# Patient Record
Sex: Female | Born: 2000 | Race: White | Hispanic: No | Marital: Single | Smoking: Never smoker
Health system: Southern US, Community
[De-identification: ages and names within clinical notes are randomized; demographics above are authoritative.]

## PROBLEM LIST (undated history)

## (undated) DIAGNOSIS — J039 Acute tonsillitis, unspecified: Secondary | ICD-10-CM

## (undated) DIAGNOSIS — J309 Allergic rhinitis, unspecified: Secondary | ICD-10-CM

## (undated) DIAGNOSIS — J358 Other chronic diseases of tonsils and adenoids: Secondary | ICD-10-CM

## (undated) DIAGNOSIS — J45909 Unspecified asthma, uncomplicated: Secondary | ICD-10-CM

## (undated) DIAGNOSIS — R196 Halitosis: Secondary | ICD-10-CM

---
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
## 2020-03-26 ENCOUNTER — Emergency Department
Admission: EM | Admit: 2020-03-26 | Discharge: 2020-03-27 | Disposition: A | Discharge status: Other Acute Inpatient Hospital | Payer: BC Managed Care – PPO | Source: Home / Self Care | Attending: Emergency Medicine | Admitting: Emergency Medicine

## 2020-03-26 ENCOUNTER — Other Ambulatory Visit: Payer: Self-pay

## 2020-03-26 ENCOUNTER — Encounter: Payer: Self-pay | Admitting: Emergency Medicine

## 2020-03-26 DIAGNOSIS — F22 Delusional disorders: Secondary | ICD-10-CM | POA: Diagnosis present

## 2020-03-26 DIAGNOSIS — F312 Bipolar disorder, current episode manic severe with psychotic features: Secondary | ICD-10-CM | POA: Insufficient documentation

## 2020-03-26 DIAGNOSIS — Z20822 Contact with and (suspected) exposure to covid-19: Secondary | ICD-10-CM | POA: Insufficient documentation

## 2020-03-26 DIAGNOSIS — F23 Brief psychotic disorder: Secondary | ICD-10-CM | POA: Diagnosis present

## 2020-03-26 DIAGNOSIS — Z79899 Other long term (current) drug therapy: Secondary | ICD-10-CM | POA: Insufficient documentation

## 2020-03-26 DIAGNOSIS — R4689 Other symptoms and signs involving appearance and behavior: Secondary | ICD-10-CM

## 2020-03-26 DIAGNOSIS — F319 Bipolar disorder, unspecified: Secondary | ICD-10-CM | POA: Diagnosis not present

## 2020-03-26 LAB — CBC
HCT: 40.3 % (ref 36.0–46.0)
Hemoglobin: 13.7 g/dL (ref 12.0–15.0)
MCH: 29.3 pg (ref 26.0–34.0)
MCHC: 34 g/dL (ref 30.0–36.0)
MCV: 86.1 fL (ref 80.0–100.0)
Platelets: 389 10*3/uL (ref 150–400)
RBC: 4.68 MIL/uL (ref 3.87–5.11)
RDW: 13.1 % (ref 11.5–15.5)
WBC: 6.5 10*3/uL (ref 4.0–10.5)
nRBC: 0 % (ref 0.0–0.2)

## 2020-03-26 LAB — COMPREHENSIVE METABOLIC PANEL
ALT: 16 U/L (ref 0–44)
AST: 24 U/L (ref 15–41)
Albumin: 4.6 g/dL (ref 3.5–5.0)
Alkaline Phosphatase: 56 U/L (ref 38–126)
Anion gap: 11 (ref 5–15)
BUN: 9 mg/dL (ref 6–20)
CO2: 24 mmol/L (ref 22–32)
Calcium: 9.3 mg/dL (ref 8.9–10.3)
Chloride: 102 mmol/L (ref 98–111)
Creatinine, Ser: 0.74 mg/dL (ref 0.44–1.00)
GFR calc Af Amer: >60 mL/min (ref >60)
GFR calc non Af Amer: >60 mL/min (ref >60)
Glucose, Bld: 88 mg/dL (ref 70–99)
Potassium: 3.7 mmol/L (ref 3.5–5.1)
Sodium: 137 mmol/L (ref 135–145)
Total Bilirubin: 0.6 mg/dL (ref 0.3–1.2)
Total Protein: 8.5 g/dL — ABNORMAL HIGH (ref 6.5–8.1)

## 2020-03-26 LAB — URINE DRUG SCREEN, QUALITATIVE (ARMC ONLY)
Amphetamines, Ur Screen: NOT DETECTED
Barbiturates, Ur Screen: NOT DETECTED
Benzodiazepine, Ur Scrn: NOT DETECTED
Cannabinoid 50 Ng, Ur ~~LOC~~: NOT DETECTED
Cocaine Metabolite,Ur ~~LOC~~: NOT DETECTED
MDMA (Ecstasy)Ur Screen: NOT DETECTED
Methadone Scn, Ur: NOT DETECTED
Opiate, Ur Screen: NOT DETECTED
Phencyclidine (PCP) Ur S: NOT DETECTED
Tricyclic, Ur Screen: NOT DETECTED

## 2020-03-26 LAB — ACETAMINOPHEN LEVEL: Acetaminophen (Tylenol), Serum: <10 ug/mL — ABNORMAL LOW (ref 10–30)

## 2020-03-26 LAB — SALICYLATE LEVEL: Salicylate Lvl: <7 mg/dL — ABNORMAL LOW (ref 7.0–30.0)

## 2020-03-26 LAB — ETHANOL: Alcohol, Ethyl (B): <10 mg/dL (ref <10)

## 2020-03-26 LAB — POCT PREGNANCY, URINE: Preg Test, Ur: NEGATIVE

## 2020-03-26 NOTE — ED Provider Notes (Signed)
[facility] Emergency Department Provider Note ____________________________________________   First MD Initiated Contact with Patient 03/26/20 1034     (approximate)  I have reviewed the triage vital signs and the nursing notes.   HISTORY  Chief Complaint Psychiatric Evaluation  Level 5 caveat: History present illness limited due to disorganized historian  HPI Chelsea Patel is a 19 y.o. female with PMH as noted below including a history of psychosis who presents with multiple complaints.  The patient states that she feels like she does not have a body and is just bones.  She believes that people are manipulating her, and that someone or something is harassing her while she is sleeping.  She states she feels like she cannot be herself.  However, she denies any acute medical complaints.  She states that she takes her psychiatric medications inconsistently.  Past Medical History:  Diagnosis Date  . Asthma    hx of  . Halitosis   . Rhinitis, allergic   . Tonsillith   . Tonsillitis    chronic    Patient Active Problem List   Diagnosis Date Noted  . Delusions ([place]) 07/07/2019  . Paranoia ([place]) 07/07/2019  . Acute psychosis ([place]) 07/07/2019    Past Surgical History:  Procedure Laterality Date  . TONSILLECTOMY AND ADENOIDECTOMY N/A 03/22/2017   Procedure: TONSILLECTOMY AND POSSIBLY  ADENOIDECTOMY;  Surgeon: Carloyn Manner, MD;  Location: [place];  Service: ENT;  Laterality: N/A;  upreg    Prior to Admission medications   Medication Sig Start Date End Date Taking? Authorizing Provider  cetirizine (ZYRTEC) 10 MG tablet Take 10 mg by mouth daily. pm    [provider]  HYDROcodone-acetaminophen (HYCET) 7.5-325 mg/15 ml solution Take 10 mLs by mouth 4 (four) times daily as needed for moderate pain. Patient not taking: Reported on 07/06/2019 03/22/17   Carloyn Manner, MD  lidocaine (XYLOCAINE) 2 % solution Use as directed 10 mLs in  the mouth or throat every 6 (six) hours as needed for mouth pain (Swish and spit). Patient not taking: Reported on 07/06/2019 03/22/17   Carloyn Manner, MD  montelukast (SINGULAIR) 10 MG tablet Take 10 mg by mouth at bedtime.    [provider]  ondansetron (ZOFRAN) 4 MG tablet Take 1 tablet (4 mg total) by mouth every 8 (eight) hours as needed for nausea or vomiting. Patient not taking: Reported on 07/06/2019 03/22/17   Carloyn Manner, MD    Allergies Omnicef [cefdinir] and Latex  History reviewed. No pertinent family history.  Social History Social History   Tobacco Use  . Smoking status: Never Smoker  . Smokeless tobacco: Never Used  Substance Use Topics  . Alcohol use: No  . Drug use: No    Review of Systems Level 5 caveat: Review of systems limited due to disorganized historian  Cardiovascular: Denies chest pain. Respiratory: Denies shortness of breath. Gastrointestinal: No vomiting or diarrhea. Musculoskeletal: Negative for back pain. Neurological: Negative for headache.   ____________________________________________   PHYSICAL EXAM:  VITAL SIGNS: ED Triage Vitals  Enc Vitals Group     BP --      Pulse --      Resp --      Temp --      Temp src --      SpO2 --      Weight 03/26/20 1026 135 lb (61.2 kg)     Height 03/26/20 1026 5\' 6"  (1.676 m)     Head Circumference --  Peak Flow --      Pain Score 03/26/20 1019 0     Pain Loc --      Pain Edu? --      Excl. in GC? --     Constitutional: Alert and oriented. Well appearing and in no acute distress. Eyes: Conjunctivae are normal.  Head: Atraumatic. Nose: No congestion/rhinnorhea. Mouth/Throat: Mucous membranes are moist.   Neck: Normal range of motion.  Cardiovascular: Good peripheral circulation. Respiratory: Normal respiratory effort.  No retractions. Gastrointestinal: No distention.  Musculoskeletal: Extremities warm and well perfused.  Neurologic:  Normal speech and language. No  gross focal neurologic deficits are appreciated.  Skin:  Skin is warm and dry. No rash noted. Psychiatric: Somewhat tangential and rambling, although grossly organized thought.  Denies SI/HI.  ____________________________________________   LABS (all labs ordered are listed, but only abnormal results are displayed)  Labs Reviewed  COMPREHENSIVE METABOLIC PANEL - Abnormal; Notable for the following components:      Result Value   Total Protein 8.5 (*)    All other components within normal limits  SALICYLATE LEVEL - Abnormal; Notable for the following components:   Salicylate Lvl <7.0 (*)    All other components within normal limits  ACETAMINOPHEN LEVEL - Abnormal; Notable for the following components:   Acetaminophen (Tylenol), Serum <10 (*)    All other components within normal limits  ETHANOL  CBC  URINE DRUG SCREEN, QUALITATIVE ([facility] ONLY)  POC URINE PREG, ED  POCT PREGNANCY, URINE   ____________________________________________  EKG   ____________________________________________  RADIOLOGY    ____________________________________________   PROCEDURES  Procedure(s) performed: No  Procedures  Critical Care performed: No ____________________________________________   INITIAL IMPRESSION / ASSESSMENT AND PLAN / ED COURSE  Pertinent labs & imaging results that were available during my care of the patient were reviewed by me and considered in my medical decision making (see chart for details).  19 year old female with PMH as noted above presents with somewhat vague symptoms, feeling she wants help for not feeling like herself.  She denies any acute medical complaints.  She also denies SI or HI.  On exam, the patient is relatively comfortable appearing and her vital signs are normal.  Lab work-up obtained for medical clearance is unremarkable.  Although the patient is somewhat tangential and appears to have somewhat bizarre nonsensical thoughts, at this time, she is  calm and cooperative, able to contract for safety, and wants to be evaluated.  There is no evidence of immediate danger to self or others and she does not require involuntary commitment based on my initial assessment.  I am concerned for psychosis versus possible substance induced symptoms.  I have consulted psychiatry and TTS.  ____________________  The patient has been placed in psychiatric observation due to the need to provide a safe environment for the patient while obtaining psychiatric consultation and evaluation, as well as ongoing medical and medication management to treat the patient's condition.  The patient has not been placed under full IVC at this time.  ----------------------------------------- 4:59 PM on 03/26/2020 -----------------------------------------  I signed the patient out to the oncoming physician Dr. Mayford Knife.  ____________________________________________   FINAL CLINICAL IMPRESSION(S) / ED DIAGNOSES  Final diagnoses:  Behavior concern      NEW MEDICATIONS STARTED DURING THIS VISIT:  New Prescriptions   No medications on file     Note:  This document was prepared using Dragon voice recognition software and may include unintentional dictation errors.    Dionne Bucy, MD  03/26/20 1659  
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
## 2020-03-26 NOTE — ED Notes (Addendum)
Pt wanting to change bed linen; new linen and blankets were given. Pt requesting/given ice water; no other needs voiced at this time. Will continue to monitor Q15 minute rounds.

## 2020-03-26 NOTE — ED Notes (Addendum)
MD Cristophano at bedside speaking to pt at this time.

## 2020-03-26 NOTE — ED Notes (Signed)
This RN asked pt if she is willing to accept visitation from grandmother at this time. Pt st "yes, I want to see my grandmother".

## 2020-03-26 NOTE — ED Notes (Signed)
Psych NP at patient bedside. 

## 2020-03-26 NOTE — ED Notes (Signed)
Pt st "I want to see my grandmother". This Rn has notified pt that STAT desk was informed pt refused visitation from grandmother.

## 2020-03-26 NOTE — ED Notes (Signed)
This RN received a phone call from [employer] [phone].

## 2020-03-26 NOTE — ED Notes (Signed)
Pt requested a food tray. [place] sandwich tray provided.

## 2020-03-26 NOTE — ED Notes (Signed)
ED Provider Siadecki at bedside. 

## 2020-03-26 NOTE — ED Notes (Signed)
Pt st "I do not want any visitors". Pt provided information to STAT front desk.

## 2020-03-26 NOTE — ED Notes (Addendum)
Grandmother at bedside with Georganna Skeans (pt realtions) at bedside at this time. Grandmother provided this RN with (friend) Dondra Prader phone number [phone].

## 2020-03-26 NOTE — Consult Note (Signed)
  Patient is a 19 year old female with extensive trauma history as well as some recent diagnosis of bipolar disorder.  Initially when patient came on she was quite talkative but clear and coherent with her speech and linear with her thought process.  Patient had an entitled nature and was making her demands known.  Patient cooperated for TTS assessment and was able to provide meaningful history.  Upon evaluation by psychiatrist, patient was difficult to arouse and seemed to be falling asleep during the course of the interview.  In addition to this she was making bizarre statements that were illogical however these were interspaced with coherent and logical thoughts.  Patient was given several hours and reassessed  Upon reassessment patient continued to fall asleep shortly after reintroduction, despite no medications be administered.  Writer feels that patient's uncooperation may be volitional.  On the differential is also drug induced psychosis but is not tested on our routine drug test.  The decision was made to continue to observe patient and assess for psychiatric symptoms as the day goes on.
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
## 2020-03-26 NOTE — ED Notes (Signed)
Pt was given food tray with juice. 

## 2020-03-27 ENCOUNTER — Encounter: Payer: Self-pay | Admitting: Behavioral Health

## 2020-03-27 ENCOUNTER — Other Ambulatory Visit: Payer: Self-pay

## 2020-03-27 ENCOUNTER — Inpatient Hospital Stay
Admission: RE | Admit: 2020-03-27 | Discharge: 2020-03-31 | DRG: 885 | Disposition: A | Discharge status: Home / Self Care | Payer: BC Managed Care – PPO | Source: Intra-hospital | Attending: Psychiatry | Admitting: Psychiatry

## 2020-03-27 DIAGNOSIS — Z91040 Latex allergy status: Secondary | ICD-10-CM | POA: Diagnosis not present

## 2020-03-27 DIAGNOSIS — Z20822 Contact with and (suspected) exposure to covid-19: Secondary | ICD-10-CM | POA: Diagnosis present

## 2020-03-27 DIAGNOSIS — Z9114 Patient's other noncompliance with medication regimen: Secondary | ICD-10-CM

## 2020-03-27 DIAGNOSIS — F23 Brief psychotic disorder: Secondary | ICD-10-CM | POA: Diagnosis present

## 2020-03-27 DIAGNOSIS — J45909 Unspecified asthma, uncomplicated: Secondary | ICD-10-CM | POA: Diagnosis present

## 2020-03-27 DIAGNOSIS — F319 Bipolar disorder, unspecified: Principal | ICD-10-CM

## 2020-03-27 DIAGNOSIS — F1290 Cannabis use, unspecified, uncomplicated: Secondary | ICD-10-CM | POA: Diagnosis present

## 2020-03-27 DIAGNOSIS — Z596 Low income: Secondary | ICD-10-CM

## 2020-03-27 DIAGNOSIS — Z888 Allergy status to other drugs, medicaments and biological substances status: Secondary | ICD-10-CM

## 2020-03-27 DIAGNOSIS — Z23 Encounter for immunization: Secondary | ICD-10-CM

## 2020-03-27 DIAGNOSIS — F3110 Bipolar disorder, current episode manic without psychotic features, unspecified: Secondary | ICD-10-CM | POA: Diagnosis not present

## 2020-03-27 DIAGNOSIS — F3164 Bipolar disorder, current episode mixed, severe, with psychotic features: Secondary | ICD-10-CM | POA: Diagnosis not present

## 2020-03-27 LAB — RESPIRATORY PANEL BY RT PCR (FLU A&B, COVID)
Influenza A by PCR: NEGATIVE
Influenza B by PCR: NEGATIVE
SARS Coronavirus 2 by RT PCR: NEGATIVE

## 2020-03-27 MED ORDER — ACETAMINOPHEN 325 MG PO TABS
650.0000 mg | ORAL_TABLET | Freq: Four times a day (QID) | ORAL | Status: DC | PRN
  Administered 2020-03-27 – 2020-03-28 (×2): 650 mg via ORAL
  Filled 2020-03-27 (×2): qty 2

## 2020-03-27 MED ORDER — ACETAMINOPHEN 325 MG PO TABS: 650.0000 mg | ORAL_TABLET | Freq: Four times a day (QID) | ORAL | Status: DC | PRN

## 2020-03-27 MED ORDER — INFLUENZA VAC SPLIT QUAD 0.5 ML IM SUSY
0.5000 mL | PREFILLED_SYRINGE | INTRAMUSCULAR | Status: AC
  Administered 2020-03-28: 0.5 mL via INTRAMUSCULAR
  Filled 2020-03-27: qty 0.5

## 2020-03-27 MED ORDER — HYDROXYZINE PAMOATE 25 MG PO CAPS: 25.0000 mg | ORAL_CAPSULE | Freq: Four times a day (QID) | ORAL | Status: DC | PRN

## 2020-03-27 MED ORDER — ALUM & MAG HYDROXIDE-SIMETH 200-200-20 MG/5ML PO SUSP: 30.0000 mL | ORAL | Status: DC | PRN

## 2020-03-27 MED ORDER — LORATADINE 10 MG PO TABS
10.0000 mg | ORAL_TABLET | Freq: Every day | ORAL | Status: DC
  Administered 2020-03-27 – 2020-03-31 (×5): 10 mg via ORAL
  Filled 2020-03-27 (×5): qty 1

## 2020-03-27 MED ORDER — HYDROXYZINE HCL 25 MG PO TABS: 25.0000 mg | ORAL_TABLET | Freq: Four times a day (QID) | ORAL | Status: DC | PRN

## 2020-03-27 MED ORDER — HYDROXYZINE HCL 25 MG PO TABS
25.0000 mg | ORAL_TABLET | Freq: Four times a day (QID) | ORAL | Status: DC | PRN
  Administered 2020-03-27 – 2020-03-30 (×5): 25 mg via ORAL
  Filled 2020-03-27 (×6): qty 1

## 2020-03-27 MED ORDER — MAGNESIUM HYDROXIDE 400 MG/5ML PO SUSP: 30.0000 mL | Freq: Every day | ORAL | Status: DC | PRN

## 2020-03-27 MED ORDER — ESCITALOPRAM OXALATE 10 MG PO TABS
10.0000 mg | ORAL_TABLET | Freq: Every day | ORAL | Status: DC
  Administered 2020-03-27 – 2020-03-30 (×4): 10 mg via ORAL
  Filled 2020-03-27 (×4): qty 1

## 2020-03-27 MED ORDER — NICOTINE 21 MG/24HR TD PT24
21.0000 mg | MEDICATED_PATCH | Freq: Every day | TRANSDERMAL | Status: DC
  Administered 2020-03-27 – 2020-03-31 (×5): 21 mg via TRANSDERMAL
  Filled 2020-03-27 (×5): qty 1

## 2020-03-27 MED ORDER — TRAZODONE HCL 50 MG PO TABS
50.0000 mg | ORAL_TABLET | Freq: Every day | ORAL | Status: DC
  Administered 2020-03-27: 50 mg via ORAL
  Filled 2020-03-27: qty 1

## 2020-03-27 MED ORDER — PNEUMOCOCCAL VAC POLYVALENT 25 MCG/0.5ML IJ INJ
0.5000 mL | INJECTION | INTRAMUSCULAR | Status: AC
  Administered 2020-03-28: 0.5 mL via INTRAMUSCULAR
  Filled 2020-03-27: qty 0.5

## 2020-03-27 MED ORDER — HYDROXYZINE HCL 25 MG PO TABS
50.0000 mg | ORAL_TABLET | Freq: Once | ORAL | Status: AC
  Administered 2020-03-27: 02:00:00 50 mg via ORAL
  Filled 2020-03-27: qty 2

## 2020-03-27 MED ORDER — TRAZODONE HCL 50 MG PO TABS
50.0000 mg | ORAL_TABLET | Freq: Every day | ORAL | Status: DC
  Administered 2020-03-27 – 2020-03-29 (×3): 50 mg via ORAL
  Filled 2020-03-27 (×3): qty 1

## 2020-03-27 NOTE — ED Notes (Signed)
Hourly rounding reveals patient sleeping in room. No complaints, stable, in no acute distress. Q15 minute rounds and monitoring via [employer] to continue. 
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
## 2020-03-27 NOTE — ED Notes (Signed)
Hourly rounding reveals patient in room laying in bed.  No complaints, stable, in no acute distress. Q15 minute rounds and monitoring via [employer] to continue.

## 2020-03-27 NOTE — ED Notes (Signed)
Hourly rounding reveals patient in room. No complaints, stable, in no acute distress. Q15 minute rounds and monitoring via [employer] to continue. 

## 2020-03-27 NOTE — ED Notes (Signed)
Patient transferred to [facility] with EDT and officer, patient received transfer papers. Patient received belongings and verbalized he has received all of his belongings. Patient appropriate and cooperative, Denies SI/HI AVH. Vital signs taken. NAD noted.

## 2020-03-27 NOTE — Plan of Care (Signed)
New admission.  Problem: Education: Goal: Knowledge of [facility] Education information/materials will improve Outcome: Not Progressing   Problem: Health Behavior/Discharge Planning: Goal: Compliance with treatment plan for underlying cause of condition will improve Outcome: Not Progressing   Problem: Safety: Goal: Periods of time without injury will increase Outcome: Not Progressing   Problem: Coping: Goal: Ability to identify and develop effective coping behavior will improve Outcome: Not Progressing   Problem: Self-Concept: Goal: Level of anxiety will decrease Outcome: Not Progressing

## 2020-03-27 NOTE — Tx Team (Signed)
Initial Treatment Plan 03/27/2020 7:16 PM Chelsea Patel UDT:[id number]    PATIENT STRESSORS: Marital or family conflict Medication change or noncompliance   PATIENT STRENGTHS: General fund of knowledge Motivation for treatment/growth Supportive family/friends Work skills   PATIENT IDENTIFIED PROBLEMS: Anxiety  Medication noncompliance  Overwhelmed                 DISCHARGE CRITERIA:  Ability to meet basic life and health needs Improved stabilization in mood, thinking, and/or behavior Need for constant or close observation no longer present  PRELIMINARY DISCHARGE PLAN: Outpatient therapy Return to previous living arrangement Return to previous work or school arrangements  PATIENT/FAMILY INVOLVEMENT: This treatment plan has been presented to and reviewed with the patient, Insurance account manager. The patient has been given the opportunity to ask questions and make suggestions.  Andrina Locken, RN 03/27/2020, 7:16 PM

## 2020-03-27 NOTE — BH Assessment (Signed)
PATIENT BED AVAILABLE AFTER 9:30AM ON 03/27/20  Patient is to be admitted to [facility] by Psychiatric Nurse Practitioner Gillermo Murdoch.  Attending Physician will be Dr. Toni Amend.   Patient has been assigned to room 314, by [facility] Charge Nurse [place].    ER staff is aware of the admission:  [facility] ER Secretary    Dr. Manson Passey, ER MD   Lurena Joiner Patient's Nurse   [facility] Patient Access.

## 2020-03-27 NOTE — ED Notes (Signed)
Patient had a visit with grandmother Kehlani Vancamp, patient had a good visit and was calm and appropriate. NAD noted.  Grandmother states she is worried about granddaughter and wants to make sure she will be safe. She just wants her granddaughter to be happy. Grandmother appears supporter

## 2020-03-27 NOTE — ED Notes (Signed)
Report to include Situation, Background, Assessment, and Recommendations received from [facility]. Patient alert and oriented, warm and dry, in no acute distress. Patient denies SI, HI, AVH and pain. Patient made aware of Q15 minute rounds and security cameras for their safety. Patient instructed to come to me with needs or concerns.

## 2020-03-27 NOTE — [facility] Group Notes (Signed)
[facility] Group Notes:  (Nursing/MHT/Case Management/Adjunct)  Date:  03/27/2020  Time:  10:40 PM  Type of Therapy:  Group Therapy  Participation Level:  Active  Participation Quality:  Appropriate, Sharing and Supportive  Affect:  Appropriate  Cognitive:  Appropriate  Insight:  Good  Engagement in Group:  Engaged  Modes of Intervention:  Discussion, Education and Support  Summary of Progress/Problems: PT states that she gonna listen to her grandparents more often cause they was RIGHT. And wants to go back to school and work.   Landry Mellow 03/27/2020, 10:40 PM

## 2020-03-27 NOTE — ED Notes (Addendum)
Hourly rounding reveals patient in room. No complaints, stable, in no acute distress. Q15 minute rounds and monitoring via Security Cameras to continue. 

## 2020-03-28 DIAGNOSIS — F3110 Bipolar disorder, current episode manic without psychotic features, unspecified: Secondary | ICD-10-CM

## 2020-03-28 MED ORDER — ARIPIPRAZOLE 10 MG PO TABS
10.0000 mg | ORAL_TABLET | Freq: Every day | ORAL | Status: DC
  Administered 2020-03-28 – 2020-03-31 (×4): 10 mg via ORAL
  Filled 2020-03-28 (×4): qty 1

## 2020-03-28 NOTE — [facility] Group Notes (Signed)
LCSW Group Therapy Note  03/28/2020 1:00pm  Type of Therapy and Topic:  Group Therapy:  Cognitive Distortions  Participation Level:  Did Not Attend   Description of Group:    Patients in this group will be introduced to the topic of cognitive distortions.  Patients will identify and describe cognitive distortions, describe the feelings these distortions create for them.  Patients will identify one or more situations in their personal life where they have cognitively distorted thinking and will verbalize challenging this cognitive distortion through positive thinking skills.  Patients will practice the skill of using positive affirmations to challenge cognitive distortions using affirmation cards.    Therapeutic Goals:  1. Patient will identify two or more cognitive distortions they have used 2. Patient will identify one or more emotions that stem from use of a cognitive distortion 3. Patient will demonstrate use of a positive affirmation to counter a cognitive distortion through discussion and/or role play. 4. Patient will describe one way cognitive distortions can be detrimental to wellness   Summary of Patient Progress:  X   Therapeutic Modalities:   Cognitive Behavioral Therapy Motivational Interviewing   Mont Nefertari Rebman, MSW, LCSWA Clinical Social Worker  03/28/2020 5:09 PM 
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
## 2020-03-28 NOTE — Progress Notes (Signed)
Client alert and ambulatory on unit. Feels overwhelmed d/t loss of parents at a young age and grand parents became care givers  and recently have had a decline health. Also state a lot of "famil stuff" going on right now that is "too much". Client claims was afraid she miscarried d/t late period, also menstruation was harder than usual. Educated client neg pregnancy test in ER. She states she is "relieved but has baby fever and wants a baby". Client also feels like "Singular caused my psychosis, d/t long term usage","Since I stopped taking it I feel like my memory is coming back" Denies SI,AVH,HI.

## 2020-03-28 NOTE — Plan of Care (Signed)
Patient oriented to unit. Patient's safety is maintained on unit. Patient denies SI/HI/AVH. Patient is adherent with scheduled medications. Patient has no complaints. Patient has completed self inventory today. Notes goal as "I am finally ready to follow plan."    Problem: Education: Goal: Knowledge of [facility] Education information/materials will improve Outcome: Progressing   Problem: Safety: Goal: Periods of time without injury will increase Outcome: Progressing   Problem: Self-Concept: Goal: Level of anxiety will decrease Outcome: Progressing

## 2020-03-28 NOTE — BHH Group Notes (Signed)
B[facility] Group Notes:  (Nursing/MHT/Case Management/Adjunct)  Date:  03/28/2020  Time:  8:59 PM  Type of Therapy:  Group Therapy  Participation Level:  Active  Participation Quality:  Appropriate  Affect:  Not excited  Cognitive:  Alert  Insight:  Good  Engagement in Group:  Engaged  Modes of Intervention:  Support  Summary of Progress/Problems:  Chelsea Patel 03/28/2020, 8:59 PM

## 2020-03-28 NOTE — Tx Team (Signed)
Interdisciplinary Treatment and Diagnostic Plan Update  03/28/2020 Time of Session: 1100AM Chelsea Patel MRN: [id number]  Principal Diagnosis: <principal problem not specified>  Secondary Diagnoses: Active Problems:   Acute psychosis (HCC)   Current Medications:  Current Facility-Administered Medications  Medication Dose Route Frequency Provider Last Rate Last Admin  . acetaminophen (TYLENOL) tablet 650 mg  650 mg Oral Q6H PRN Gillermo Murdoch, NP   650 mg at 03/28/20 1530  . alum & mag hydroxide-simeth (MAALOX/MYLANTA) 200-200-20 MG/5ML suspension 30 mL  30 mL Oral Q4H PRN Gillermo Murdoch, NP      . ARIPiprazole (ABILIFY) tablet 10 mg  10 mg Oral Daily Thalia Party, MD   10 mg at 03/28/20 1530  . escitalopram (LEXAPRO) tablet 10 mg  10 mg Oral Daily Gillermo Murdoch, NP   10 mg at 03/28/20 0753  . hydrOXYzine (ATARAX/VISTARIL) tablet 25 mg  25 mg Oral Q6H PRN Gillermo Murdoch, NP   25 mg at 03/27/20 2121  . loratadine (CLARITIN) tablet 10 mg  10 mg Oral Daily Gillermo Murdoch, NP   10 mg at 03/28/20 0753  . magnesium hydroxide (MILK OF MAGNESIA) suspension 30 mL  30 mL Oral Daily PRN Gillermo Murdoch, NP      . nicotine (NICODERM CQ - dosed in mg/24 hours) patch 21 mg  21 mg Transdermal Daily Clapacs, Jackquline Denmark, MD   21 mg at 03/28/20 0755  . traZODone (DESYREL) tablet 50 mg  50 mg Oral QHS Gillermo Murdoch, NP   50 mg at 03/27/20 2121   PTA Medications: Medications Prior to Admission  Medication Sig Dispense Refill Last Dose  . cetirizine (ZYRTEC) 10 MG tablet Take 10 mg by mouth daily as needed for allergies.      Marland Kitchen escitalopram (LEXAPRO) 10 MG tablet Take 10 mg by mouth daily.     . hydrOXYzine (VISTARIL) 25 MG capsule Take 25 mg by mouth every 6 (six) hours as needed for anxiety.     . montelukast (SINGULAIR) 10 MG tablet Take 10 mg by mouth at bedtime.     . SPRINTEC 28 0.25-35 MG-MCG tablet Take 1 tablet by mouth daily.     . traZODone (DESYREL) 50 MG  tablet Take 50 mg by mouth at bedtime as needed for sleep.       Patient Stressors: Marital or family conflict Medication change or noncompliance  Patient Strengths: Geographical information systems officer for treatment/growth Supportive family/friends Work skills  Treatment Modalities: Medication Management, Group therapy, Case management,  1 to 1 session with clinician, Psychoeducation, Recreational therapy.   Physician Treatment Plan for Primary Diagnosis: <principal problem not specified> Long Term Goal(s): Improvement in symptoms so as ready for discharge Improvement in symptoms so as ready for discharge   Short Term Goals: Ability to identify changes in lifestyle to reduce recurrence of condition will improve Ability to verbalize feelings will improve Ability to disclose and discuss suicidal ideas Ability to demonstrate self-control will improve Ability to identify and develop effective coping behaviors will improve Ability to maintain clinical measurements within normal limits will improve Compliance with prescribed medications will improve Ability to identify triggers associated with substance abuse/mental health issues will improve Ability to identify changes in lifestyle to reduce recurrence of condition will improve Ability to verbalize feelings will improve Ability to disclose and discuss suicidal ideas Ability to demonstrate self-control will improve Ability to identify and develop effective coping behaviors will improve Ability to maintain clinical measurements within normal limits will improve Compliance with prescribed medications  will improve Ability to identify triggers associated with substance abuse/mental health issues will improve  Medication Management: Evaluate patient's response, side effects, and tolerance of medication regimen.  Therapeutic Interventions: 1 to 1 sessions, Unit Group sessions and Medication administration.  Evaluation of Outcomes:  Progressing  Physician Treatment Plan for Secondary Diagnosis: Active Problems:   Acute psychosis (HCC)  Long Term Goal(s): Improvement in symptoms so as ready for discharge Improvement in symptoms so as ready for discharge   Short Term Goals: Ability to identify changes in lifestyle to reduce recurrence of condition will improve Ability to verbalize feelings will improve Ability to disclose and discuss suicidal ideas Ability to demonstrate self-control will improve Ability to identify and develop effective coping behaviors will improve Ability to maintain clinical measurements within normal limits will improve Compliance with prescribed medications will improve Ability to identify triggers associated with substance abuse/mental health issues will improve Ability to identify changes in lifestyle to reduce recurrence of condition will improve Ability to verbalize feelings will improve Ability to disclose and discuss suicidal ideas Ability to demonstrate self-control will improve Ability to identify and develop effective coping behaviors will improve Ability to maintain clinical measurements within normal limits will improve Compliance with prescribed medications will improve Ability to identify triggers associated with substance abuse/mental health issues will improve     Medication Management: Evaluate patient's response, side effects, and tolerance of medication regimen.  Therapeutic Interventions: 1 to 1 sessions, Unit Group sessions and Medication administration.  Evaluation of Outcomes: Progressing   RN Treatment Plan for Primary Diagnosis: <principal problem not specified> Long Term Goal(s): Knowledge of disease and therapeutic regimen to maintain health will improve  Short Term Goals: Ability to remain free from injury will improve, Ability to verbalize frustration and anger appropriately will improve, Ability to demonstrate self-control, Ability to participate in decision  making will improve, Ability to verbalize feelings will improve, Ability to disclose and discuss suicidal ideas, Ability to identify and develop effective coping behaviors will improve and Compliance with prescribed medications will improve  Medication Management: RN will administer medications as ordered by provider, will assess and evaluate patient's response and provide education to patient for prescribed medication. RN will report any adverse and/or side effects to prescribing provider.  Therapeutic Interventions: 1 on 1 counseling sessions, Psychoeducation, Medication administration, Evaluate responses to treatment, Monitor vital signs and CBGs as ordered, Perform/monitor CIWA, COWS, AIMS and Fall Risk screenings as ordered, Perform wound care treatments as ordered.  Evaluation of Outcomes: Progressing   LCSW Treatment Plan for Primary Diagnosis: <principal problem not specified> Long Term Goal(s): Safe transition to appropriate next level of care at discharge, Engage patient in therapeutic group addressing interpersonal concerns.  Short Term Goals: Engage patient in aftercare planning with referrals and resources, Increase social support, Increase ability to appropriately verbalize feelings, Increase emotional regulation, Identify triggers associated with mental health/substance abuse issues and Increase skills for wellness and recovery  Therapeutic Interventions: Assess for all discharge needs, 1 to 1 time with Social worker, Explore available resources and support systems, Assess for adequacy in community support network, Educate family and significant other(s) on suicide prevention, Complete Psychosocial Assessment, Interpersonal group therapy.  Evaluation of Outcomes: Progressing   Progress in Treatment: Attending groups: No. Participating in groups: No. Taking medication as prescribed: Yes. Toleration medication: Yes. Family/Significant other contact made: No, will contact:  Pt  will identify a contact person Patient understands diagnosis: Yes. Discussing patient identified problems/goals with staff: Yes. Medical problems stabilized or resolved: Yes.  Denies suicidal/homicidal ideation: Yes. Issues/concerns per patient self-inventory: No. Other: None  New problem(s) identified: No, Describe:  None  New Short Term/Long Term Goal(s):  Patient Goals:  "I want to get back on schedule regarding my sleeping and medication"  Discharge Plan or Barriers: Pt plans to have follow up after care with Dr. Kerin Salen at [facility].  Reason for Continuation of Hospitalization: Hallucinations Medication stabilization  Estimated Length of Stay: 3-5 days  Attendees: Patient: Chelsea Patel 03/28/2020 3:51 PM  Physician: Dr. Danella Sensing, MD 03/28/2020 3:51 PM  Nursing: Alyson Locket, RN 03/28/2020 3:51 PM  RN Care Manager: 03/28/2020 3:51 PM  Social Worker: Juanetta Beets, [place] 03/28/2020 3:51 PM  Recreational Therapist:  03/28/2020 3:51 PM  Other:  03/28/2020 3:51 PM  Other:  03/28/2020 3:51 PM  Other: 03/28/2020 3:51 PM    Scribe for Treatment Team: Charlott Rakes, [place] 03/28/2020 4:41 PM

## 2020-03-28 NOTE — BHH Group Notes (Signed)
BHH Group Notes:  (Nursing/MHT/Case Management/Adjunct)  Date:  03/28/2020  Time:  5:32 PM  Type of Therapy:  Psychoeducational Skills  Participation Level:  Appropriate  Participation Quality:  Appropriate  Affect:  Appropriate  Cognitive:  Alert and Appropriate  Insight:  Appropriate and Good  Engagement in Group:  Engaged  Modes of Intervention:  Clarification  Summary of Progress/Problems:  Chelsea Patel 03/28/2020, 5:32 PM

## 2020-03-28 NOTE — BHH Suicide Risk Assessment (Signed)
[facility] Admission Suicide Risk Assessment   Nursing information obtained from:  Patient Demographic factors:  Adolescent or young adult Current Mental Status:  NA Loss Factors:  NA Historical Factors:  NA Risk Reduction Factors:  Employed, Living with another person, especially a relative  Total Time spent with patient: 30 minutes Principal Problem: <principal problem not specified> Diagnosis:  Active Problems:   Acute psychosis (HCC)  Subjective Data:  Ms.Nunnery is a 19yo F with past history of bipolar disorder and cannabis use disorder, who was admitted to [facility] unit for expressing bizarre behavior. Patient presents with elation, increased energy; elevated mood, flight of ideas, talkativeness, reported increased goal directed activity and high risk-taking behaviors, multiple delusions, patient is currently in acute manic episode of bipolar disorder in settings of non-compliance of medications and substance use.     SUICIDE RISK:   Mild:  Suicidal ideation of limited frequency, intensity, duration, and specificity.  There are no identifiable plans, no associated intent, mild dysphoria and related symptoms, good self-control (both objective and subjective assessment), few other risk factors, and identifiable protective factors, including available and accessible social support.  No guns in possession.   PLAN OF CARE:  -continue inpatient psych admission; 15-minute checks; daily contact with patient to assess and evaluate symptoms and progress in treatment; psychoeducation.  -start Abilify 10mg  PO daily for mania and psychosis; will consider switching to LAI Abilify Maintena.  -Disposition: to be determined.   I certify that inpatient services furnished can reasonably be expected to improve the patient's condition.   , MD 03/28/2020, 4:07 PM

## 2020-03-29 NOTE — Plan of Care (Signed)
D- Patient alert and oriented. Patient presents in a pleasant mood on assessment stating that she slept "really good" last night and had no complaints to voice to this Clinical research associate. Patient denies any signs of depression, stating "I don't feel like that now", however, she did report that her boyfriend is making her anxious. Patient also denies SI, HI, AVH, and pain at this time. Patient had no stated goals for today.  A- Scheduled medications administered to patient, per MD orders. Support and encouragement provided.  Routine safety checks conducted every 15 minutes.  Patient informed to notify staff with problems or concerns.  R- No adverse drug reactions noted. Patient contracts for safety at this time. Patient compliant with medications and treatment plan. Patient receptive, calm, and cooperative. Patient interacts well with others on the unit.  Patient remains safe at this time.  Problem: Education: Goal: Knowledge of [facility] Education information/materials will improve Outcome: Progressing   Problem: Health Behavior/Discharge Planning: Goal: Compliance with treatment plan for underlying cause of condition will improve Outcome: Progressing   Problem: Safety: Goal: Periods of time without injury will increase Outcome: Progressing   Problem: Coping: Goal: Ability to identify and develop effective coping behavior will improve Outcome: Progressing   Problem: Self-Concept: Goal: Level of anxiety will decrease Outcome: Progressing

## 2020-03-29 NOTE — Progress Notes (Signed)
Patient has been called multiple times to come up for morning medication, however, she is still in bed. This writer will administer medication once she wakes up.

## 2020-03-29 NOTE — [facility] Group Notes (Signed)
03/29/2020 1:00pm   Type of Therapy and Topic:  Group Therapy:  Change and Accountability  Participation Level:  Active  Description of Group In this group, patients discussed power and accountability for change.  The group identified the challenges related to accountability and the difficulty of accepting the outcomes of negative behaviors.  Patients were encouraged to openly discuss a challenge/change they could take responsibility for.  Patients discussed the use of "change talk" and positive thinking as ways to support achievement of personal goals.  The group discussed ways to give support and empowerment to peers.  Therapeutic Goals: 1. Patients will state the relationship between personal power and accountability in the change process 2. Patients will identify the positive and negative consequences of a personal choice they have made 3. Patients will identify one challenge/choice they will take responsibility for making 4. Patients will discuss the role of "change talk" and the impact of positive thinking as it supports successful personal change 5. Patients will verbalize support and affirmation of change efforts in peers  Summary of Patient Progress:  The patient identified one current behavior she has challenges changing. The patient stated she believes she is on the stage of the stages of change. The patient was able to list actions steps, barriers and ways the people in their life could help them with changes.   Therapeutic Modalities Solution Focused Brief Therapy Motivational Interviewing Cognitive Behavioral Therapy    Teresita Madura, MSW, [facility] Clinical Social Worker  03/29/2020 3:46 PM

## 2020-03-29 NOTE — Plan of Care (Signed)
Patient was cooperative with treatment and on approach and she visible in the evening interacting with peers. No delusions voiced on shift or bizarre behavior. She appears to be resting in bed quietly at this time.

## 2020-03-29 NOTE — Progress Notes (Signed)
[facility] MD Progress Note  03/29/2020 11:53 AM Chelsea Patel  MRN:  [id number]   Chelsea Patel is a 19yo F with past history of bipolar disorder and cannabis use disorder, who was admitted to [facility] unit for expressing bizarre behavior.  Patient seen.  Chart reviewed. Patient discussed with nursing; no overnight events reported. Patient was cooperative with treatment and on approach and she visible in the evening interacting with peers. No delusions voiced on shift or bizarre behavior.  Subjective:  Patient reports feeling "good". She states she feels calmer and slept well. Denies hallucinations and does not express delusions. Denies suicidal or homicidal thoughts. No side effects from the first dose of medication. She says she likes the medication and is willing to continue. She thought about LAI option and is willing to do that. She has an outpatient therapist, but not a psychiatrist and needs to be connected to one.    Principal Problem: <principal problem not specified> Diagnosis: Active Problems:   Acute psychosis ([place])  Total Time spent with patient: 15 minutes  Past Psychiatric History:  Psychiatrist: none currently  Therapist: None currently  Hospitalizations: one prior to [facility] in July 2020 Suicide attempts: denies  Previous medication trials: Zyprexa per chart review.   Past Medical History:  Past Medical History:  Diagnosis Date  . Asthma    hx of  . Halitosis   . Rhinitis, allergic   . Tonsillith   . Tonsillitis    chronic    Past Surgical History:  Procedure Laterality Date  . TONSILLECTOMY AND ADENOIDECTOMY N/A 03/22/2017   Procedure: TONSILLECTOMY AND POSSIBLY  ADENOIDECTOMY;  Surgeon: Carloyn Manner, MD;  Location: [place];  Service: ENT;  Laterality: N/A;  upreg   Family History: History reviewed. No pertinent family history. Family Psychiatric  History:  Mother and father committed suicide when the patient was 7  Social History:  Social  History   Substance and Sexual Activity  Alcohol Use No     Social History   Substance and Sexual Activity  Drug Use No    Social History   Socioeconomic History  . Marital status: Single    Spouse name: Not on file  . Number of children: Not on file  . Years of education: Not on file  . Highest education level: Not on file  Occupational History  . Not on file  Tobacco Use  . Smoking status: Never Smoker  . Smokeless tobacco: Never Used  Substance and Sexual Activity  . Alcohol use: No  . Drug use: No  . Sexual activity: Not on file  Other Topics Concern  . Not on file  Social History Narrative  . Not on file   Social Determinants of Health   Financial Resource Strain:   . Difficulty of Paying Living Expenses:   Food Insecurity:   . Worried About Charity fundraiser in the Last Year:   . Arboriculturist in the Last Year:   Transportation Needs:   . Film/video editor (Medical):   Marland Kitchen Lack of Transportation (Non-Medical):   Physical Activity:   . Days of Exercise per Week:   . Minutes of Exercise per Session:   Stress:   . Feeling of Stress :   Social Connections:   . Frequency of Communication with Friends and Family:   . Frequency of Social Gatherings with Friends and Family:   . Attends Religious Services:   . Active Member of Clubs or Organizations:   .  Attends Banker Meetings:   Marland Kitchen Marital Status:    Additional Social History:    Pain Medications: see PTA Prescriptions: see PTA Over the Counter: see PTA History of alcohol / drug use?: No history of alcohol / drug abuse                    Sleep: Good  Appetite:  Good  Current Medications: Current Facility-Administered Medications  Medication Dose Route Frequency Provider Last Rate Last Admin  . acetaminophen (TYLENOL) tablet 650 mg  650 mg Oral Q6H PRN Gillermo Murdoch, NP   650 mg at 03/28/20 1530  . alum & mag hydroxide-simeth (MAALOX/MYLANTA) 200-200-20 MG/5ML  suspension 30 mL  30 mL Oral Q4H PRN Gillermo Murdoch, NP      . ARIPiprazole (ABILIFY) tablet 10 mg  10 mg Oral Daily Thalia Party, MD   10 mg at 03/28/20 1530  . escitalopram (LEXAPRO) tablet 10 mg  10 mg Oral Daily Gillermo Murdoch, NP   10 mg at 03/28/20 0753  . hydrOXYzine (ATARAX/VISTARIL) tablet 25 mg  25 mg Oral Q6H PRN Gillermo Murdoch, NP   25 mg at 03/28/20 2139  . loratadine (CLARITIN) tablet 10 mg  10 mg Oral Daily Gillermo Murdoch, NP   10 mg at 03/28/20 0753  . magnesium hydroxide (MILK OF MAGNESIA) suspension 30 mL  30 mL Oral Daily PRN Gillermo Murdoch, NP      . nicotine (NICODERM CQ - dosed in mg/24 hours) patch 21 mg  21 mg Transdermal Daily Clapacs, Jackquline Denmark, MD   21 mg at 03/28/20 0755  . traZODone (DESYREL) tablet 50 mg  50 mg Oral QHS Gillermo Murdoch, NP   50 mg at 03/28/20 2139    Lab Results: No results found for this or any previous visit (from the past 48 hour(s)).  Blood Alcohol level:  Lab Results  Component Value Date   ETH <10 03/26/2020    Metabolic Disorder Labs: No results found for: HGBA1C, MPG No results found for: PROLACTIN No results found for: CHOL, TRIG, HDL, CHOLHDL, VLDL, LDLCALC  Physical Findings: AIMS:  , ,  ,  ,    CIWA:    COWS:     Musculoskeletal: Strength & Muscle Tone: within normal limits Gait & Station: normal Patient leans: N/A  Psychiatric Specialty Exam: Physical Exam  Review of Systems  Blood pressure 122/70, pulse 68, temperature 98.3 F (36.8 C), temperature source Oral, resp. rate 17, height 5\' 6"  (1.676 m), weight 66.2 kg, SpO2 100 %, unknown if currently breastfeeding.Body mass index is 23.57 kg/m.  General Appearance: Casual  Eye Contact:  Good  Speech:  Normal Rate  Volume:  Normal  Mood:  Euthymic  Affect:  Full Range  Thought Process:  Coherent and Goal Directed  Orientation:  Full (Time, Place, and Person)  Thought Content:  Logical  Suicidal Thoughts:  No  Homicidal Thoughts:   No  Memory:  NA Immediate;   Fair Recent;   Fair Remote;   Fair  Judgement:  Fair  Insight:  Fair  Psychomotor Activity:  Normal  Concentration:  Concentration: Fair and Attention Span: Fair  Recall:  of Knowledge:  Fair  Language:  Fair  Akathisia:  No  Handed:  Right  AIMS (if indicated):     Assets:  Communication Skills Housing Physical Health Social Support  ADL's:  Intact  Cognition:  WNL  Sleep:  Number of Hours: 7     Treatment  Plan Summary: Daily contact with patient to assess and evaluate symptoms and progress in treatment and Medication management   19yo F with past history of bipolar disorder and cannabis use disorder, who was admitted to [facility] unit for expressing bizarre behavior. She appears calmer today. She does not express delusions. No unsafe thoughts. Patient started oral Abilify yesterday, no side effects observed or reported. She would benefit from switching to LAI medication taking in consideration chronic issues with med compliance at home.  Plan: -continue inpatient psych admission; 15-minute checks; daily contact with patient to assess and evaluate symptoms and progress in treatment; psychoeducation.  -continue Abilify 10mg  PO daily for mania and psychosis; if patiern continues to tolerate - LAI Abilify Maintena can be administered next week prior to discharge.  -Disposition: to be determined. ptient needs referral to outpatient psychiatrist for f/u and medication management.  , MD 03/29/2020, 11:53 AM

## 2020-03-30 DIAGNOSIS — F319 Bipolar disorder, unspecified: Secondary | ICD-10-CM

## 2020-03-30 DIAGNOSIS — F3164 Bipolar disorder, current episode mixed, severe, with psychotic features: Secondary | ICD-10-CM

## 2020-03-30 MED ORDER — MELATONIN 3 MG PO TABS: 3.0000 mg | ORAL_TABLET | Freq: Every day | ORAL | Status: DC

## 2020-03-30 MED ORDER — DIPHENHYDRAMINE HCL 25 MG PO CAPS: 50.0000 mg | ORAL_CAPSULE | Freq: Every evening | ORAL | Status: DC | PRN

## 2020-03-30 MED ORDER — MELATONIN 5 MG PO TABS
5.0000 mg | ORAL_TABLET | Freq: Every day | ORAL | Status: DC
  Administered 2020-03-30: 5 mg via ORAL
  Filled 2020-03-30: qty 1

## 2020-03-30 NOTE — Progress Notes (Signed)
[facility] MD Progress Note  03/30/2020 5:07 PM Chelsea Patel  MRN:  [id number] Subjective: 19 year old woman with what sounds almost certainly like bipolar disorder.  Patient was admitted to the hospital with worsening of psychosis bizarre behavior agitation.  On interview today the patient shows pretty good insight about the whole thing.  She remembers the psychotic symptoms.  She feels like her recent medication had not been working.  Since coming into the hospital she was started on low-dose Abilify and is feeling much better.  Currently not making any psychotic statements denies the psychotic issues that brought her into the hospital.  Denies suicidal or homicidal ideation and is agreeable with appropriate treatment planning. Principal Problem: Bipolar disorder (HCC) Diagnosis: Principal Problem:   Bipolar disorder (HCC) Active Problems:   Acute psychosis (HCC)  Total Time spent with patient: 30 minutes  Past Psychiatric History: Patient has a past history of mental health problems this is her third psychiatric admission.  Previous treatment with antidepressants.  Past Medical History:  Past Medical History:  Diagnosis Date  . Asthma    hx of  . Halitosis   . Rhinitis, allergic   . Tonsillith   . Tonsillitis    chronic    Past Surgical History:  Procedure Laterality Date  . TONSILLECTOMY AND ADENOIDECTOMY N/A 03/22/2017   Procedure: TONSILLECTOMY AND POSSIBLY  ADENOIDECTOMY;  Surgeon: Bud Face, MD;  Location: [facility];  Service: ENT;  Laterality: N/A;  upreg   Family History: History reviewed. No pertinent family history. Family Psychiatric  History: Family history of bipolar disorder Social History:  Social History   Substance and Sexual Activity  Alcohol Use No     Social History   Substance and Sexual Activity  Drug Use No    Social History   Socioeconomic History  . Marital status: Single    Spouse name: Not on file  . Number of children: Not on  file  . Years of education: Not on file  . Highest education level: Not on file  Occupational History  . Not on file  Tobacco Use  . Smoking status: Never Smoker  . Smokeless tobacco: Never Used  Substance and Sexual Activity  . Alcohol use: No  . Drug use: No  . Sexual activity: Not on file  Other Topics Concern  . Not on file  Social History Narrative  . Not on file   Social Determinants of Health   Financial Resource Strain:   . Difficulty of Paying Living Expenses:   Food Insecurity:   . Worried About Programme researcher, broadcasting/film/video in the Last Year:   . Barista in the Last Year:   Transportation Needs:   . Freight forwarder (Medical):   Marland Kitchen Lack of Transportation (Non-Medical):   Physical Activity:   . Days of Exercise per Week:   . Minutes of Exercise per Session:   Stress:   . Feeling of Stress :   Social Connections:   . Frequency of Communication with Friends and Family:   . Frequency of Social Gatherings with Friends and Family:   . Attends Religious Services:   . Active Member of Clubs or Organizations:   . Attends Banker Meetings:   Marland Kitchen Marital Status:    Additional Social History:    Pain Medications: see PTA Prescriptions: see PTA Over the Counter: see PTA History of alcohol / drug use?: No history of alcohol / drug abuse  Sleep: Fair  Appetite:  Fair  Current Medications: Current Facility-Administered Medications  Medication Dose Route Frequency Provider Last Rate Last Admin  . acetaminophen (TYLENOL) tablet 650 mg  650 mg Oral Q6H PRN Caroline Sauger, NP   650 mg at 03/28/20 1530  . alum & mag hydroxide-simeth (MAALOX/MYLANTA) 200-200-20 MG/5ML suspension 30 mL  30 mL Oral Q4H PRN Caroline Sauger, NP      . ARIPiprazole (ABILIFY) tablet 10 mg  10 mg Oral Daily Larita Fife, MD   10 mg at 03/30/20 0900  . diphenhydrAMINE (BENADRYL) capsule 50 mg  50 mg Oral QHS PRN Doneen Ollinger T, MD      .  hydrOXYzine (ATARAX/VISTARIL) tablet 25 mg  25 mg Oral Q6H PRN Caroline Sauger, NP   25 mg at 03/29/20 2155  . loratadine (CLARITIN) tablet 10 mg  10 mg Oral Daily Caroline Sauger, NP   10 mg at 03/30/20 0900  . magnesium hydroxide (MILK OF MAGNESIA) suspension 30 mL  30 mL Oral Daily PRN Caroline Sauger, NP      . melatonin tablet 5 mg  5 mg Oral QHS Damiana Berrian T, MD      . nicotine (NICODERM CQ - dosed in mg/24 hours) patch 21 mg  21 mg Transdermal Daily Neomi Laidler T, MD   21 mg at 03/30/20 0900    Lab Results: No results found for this or any previous visit (from the past 48 hour(s)).  Blood Alcohol level:  Lab Results  Component Value Date   ETH <10 81/12/7508    Metabolic Disorder Labs: No results found for: HGBA1C, MPG No results found for: PROLACTIN No results found for: CHOL, TRIG, HDL, CHOLHDL, VLDL, LDLCALC  Physical Findings: AIMS:  , ,  ,  ,    CIWA:    COWS:     Musculoskeletal: Strength & Muscle Tone: within normal limits Gait & Station: normal Patient leans: N/A  Psychiatric Specialty Exam: Physical Exam  Nursing note and vitals reviewed. Constitutional: She appears well-developed and well-nourished.  HENT:  Head: Normocephalic and atraumatic.  Eyes: Pupils are equal, round, and reactive to light. Conjunctivae are normal.  Cardiovascular: Regular rhythm and normal heart sounds.  Respiratory: Effort normal.  GI: Soft.  Musculoskeletal:        General: Normal range of motion.     Cervical back: Normal range of motion.  Neurological: She is alert.  Skin: Skin is warm and dry.  Psychiatric: She has a normal mood and affect. Her speech is normal and behavior is normal. Judgment and thought content normal. Cognition and memory are normal.    Review of Systems  Constitutional: Negative.   HENT: Negative.   Eyes: Negative.   Respiratory: Negative.   Cardiovascular: Negative.   Gastrointestinal: Negative.   Musculoskeletal: Negative.    Skin: Negative.   Neurological: Negative.   Psychiatric/Behavioral: Negative.     Blood pressure 113/65, pulse 73, temperature 98.3 F (36.8 C), temperature source Oral, resp. rate 18, height 5\' 6"  (1.676 m), weight 66.2 kg, SpO2 99 %, unknown if currently breastfeeding.Body mass index is 23.57 kg/m.  General Appearance: Casual  Eye Contact:  Good  Speech:  Clear and Coherent  Volume:  Normal  Mood:  Euthymic  Affect:  Congruent  Thought Process:  Goal Directed  Orientation:  Full (Time, Place, and Person)  Thought Content:  Logical  Suicidal Thoughts:  No  Homicidal Thoughts:  No  Memory:  Immediate;   Fair Recent;   Fair Remote;  Fair  Judgement:  Fair  Insight:  Fair  Psychomotor Activity:  Normal  Concentration:  Concentration: Fair  Recall:  [employer] of Knowledge:  Fair  Language:  Fair  Akathisia:  No  Handed:  Right  AIMS (if indicated):     Assets:  Desire for Improvement  ADL's:  Intact  Cognition:  WNL  Sleep:  Number of Hours: 6.75     Treatment Plan Summary: Daily contact with patient to assess and evaluate symptoms and progress in treatment, Medication management and Plan Patient appears to be significantly improved from admission.  We discussed the pharmacal cotherapy of bipolar disorder and I propose that we discontinue the Lexapro and the trazodone replacing the trazodone with as needed Benadryl or melatonin for sleep.  Patient is fully agreeable.  No change to Abilify or other medicine.  Patient appears to be returning to baseline and will likely be ready for discharge tomorrow.  Mordecai Rasmussen, MD 03/30/2020, 5:07 PM

## 2020-03-30 NOTE — Plan of Care (Signed)
Pt stated she has been working on her coping skills and feels ready to leave  Problem: Education: Goal: Knowledge of [place] [facility] Education information/materials will improve Outcome: Progressing   Problem: Coping: Goal: Ability to identify and develop effective coping behavior will improve Outcome: Progressing

## 2020-03-30 NOTE — Progress Notes (Signed)
Recreation Therapy Notes  INPATIENT RECREATION THERAPY ASSESSMENT  Patient Details Name: Chelsea Patel MRN: [id number] DOB: 28-Jan-2001 Today's Date: 03/30/2020       Information Obtained From: Patient  Able to Participate in Assessment/Interview: Yes  Patient Presentation: Responsive  Reason for Admission (Per Patient): Active Symptoms  Patient Stressors: Family, Death  Coping Skills:   Write, Talk, Art, Prayer  Leisure Interests (2+):  Nature - Hiking, Technical brewer - Camping(My therapy dog)  Frequency of Recreation/Participation:    Awareness of Community Resources:     [employer]:     Current Use:    If no, Barriers?:    Expressed Interest in [employer] Information:    [place] of Residence:  Film/video editor  Patient Main Form of Transportation: Set designer  Patient Strengths:  Writing  Patient Identified Areas of Improvement:  Resting my mind  Patient Goal for Hospitalization:  Getting things together  Current SI (including self-harm):  No  Current HI:  No  Current AVH: No  Staff Intervention Plan: Group Attendance, Collaborate with Interdisciplinary Treatment Team  Consent to Intern Participation: N/A  Chelsea Patel 03/30/2020, 3:06 PM

## 2020-03-30 NOTE — Progress Notes (Signed)
D: Patient has been pleasant and cooperative. Denies SI, HI and AVH. Does complain of some anxiety and was given hydroxyzine per prn order with relief. States that her family makes her anxious. Was observed talking with her family on the phone earlier. Also states that the pandemic has placed strain on her and her family. Contracts for safety A: Continue to monitor for safety R: Safety maintained.

## 2020-03-30 NOTE — Progress Notes (Signed)
Recreation Therapy Notes  Date: 03/30/2020  Time: 9:30 am  Location: Craft Room  Behavioral response: Appropriate   Intervention Topic: Happiness   Discussion/Intervention:  Group content today was focused on Happiness. The group defined happiness and described where happiness comes from. Individuals identified what makes them happy and how they go about making others happy. Patients expressed things that stop them from being happy and ways they can improve their happiness. The group stated reasons why it is important to be happy. The group participated in the intervention "My Happiness", where they had a chance to identify and express things that make them happy. Clinical Observations/Feedback:  Patient came to group and defined happiness as being at peace with yourself. She expressed that her pets and nature makes her happy.Individual was social with peers and staff while participating in the intervention during group.  Chelsea Patel LRT/CTRS          Crisol Muecke 03/30/2020 11:48 AM

## 2020-03-30 NOTE — [facility] Group Notes (Signed)

## 2020-03-30 NOTE — Plan of Care (Signed)
D- Patient alert and oriented. Patient presents in a pleasant mood on assessment stating that she slept "ok" last night, "not as good as the other nights, but ok". Patient had no complaints to voice to this Clinical research associate. Patient denies any signs/symptoms of depression/anxiety stating "I don't feel any today". Patient also denies SI, HI, AVH, and pain at this time at this time. Patient's goal for today is "going home- making sure my medicine is correct", in which she will "talk with doctor", in order to achieve her goal".  A- Scheduled medications administered to patient, per MD orders. Support and encouragement provided.  Routine safety checks conducted every 15 minutes.  Patient informed to notify staff with problems or concerns.  R- No adverse drug reactions noted. Patient contracts for safety at this time. Patient compliant with medications and treatment plan. Patient receptive, calm, and cooperative. Patient interacts well with others on the unit.  Patient remains safe at this time.  Problem: Education: Goal: Knowledge of [place] [facility] Education information/materials will improve Outcome: Progressing   Problem: Health Behavior/Discharge Planning: Goal: Compliance with treatment plan for underlying cause of condition will improve Outcome: Progressing   Problem: Safety: Goal: Periods of time without injury will increase Outcome: Progressing   Problem: Coping: Goal: Ability to identify and develop effective coping behavior will improve Outcome: Progressing   Problem: Self-Concept: Goal: Level of anxiety will decrease Outcome: Progressing

## 2020-03-30 NOTE — Plan of Care (Signed)
  Problem: Education: Goal: Knowledge of [place] [facility] Education information/materials will improve Outcome: Progressing  D: Patient has been pleasant and cooperative. Denies SI, HI and AVH. Does complain of some anxiety and was given hydroxyzine per prn order with relief. States that her family makes her anxious. Was observed talking with her family on the phone earlier. Also states that the pandemic has placed strain on her and her family. Contracts for safety A: Continue to monitor for safety R: Safety maintained.

## 2020-03-30 NOTE — Progress Notes (Signed)
Patient was in her room upon arrival to the unit. Patient was pleasant during assessment denying SI/HI/AVH, pain and depression. Pt endorsed anxiety and requested PRN anxiety medications. Pt compliant with medication administration per MD orders. Pt observed interacting appropriately with staff and peers on the unit. Pt given education, support and encouragement to be active in her treatment plan. Patient stated she is going to be leaving tomorrow and feels ready. Pt being monitored Q 15 minutes for safety per unit protocol, pt remains safe on the unit.

## 2020-03-31 MED ORDER — HYDROXYZINE HCL 25 MG PO TABS: 25.0000 mg | ORAL_TABLET | Freq: Four times a day (QID) | ORAL | 1 refills | Status: DC | PRN

## 2020-03-31 MED ORDER — ARIPIPRAZOLE 10 MG PO TABS: 10.0000 mg | ORAL_TABLET | Freq: Every day | ORAL | 1 refills | Status: DC

## 2020-03-31 NOTE — Progress Notes (Signed)
D- Patient alert and oriented. Patient presents in a pleasant mood on assessment stating that she slept good last night and had no complaints to voice to this writer at this time. Patient denies SI, HI, AVH, and pain at this time. Patient also denies any signs/symptoms of depression/anxiety. Patient's goal for today is "discharge as doctor said-stay hopeful", in which she will "stay relaxed & enjoy my time", in order to achieve her goal.  A- Scheduled medications administered to patient, per MD orders. Support and encouragement provided.  Routine safety checks conducted every 15 minutes.  Patient informed to notify staff with problems or concerns.  R- No adverse drug reactions noted. Patient contracts for safety at this time. Patient compliant with medications and treatment plan. Patient receptive, calm, and cooperative. Patient interacts well with others on the unit.  Patient remains safe at this time.

## 2020-03-31 NOTE — Progress Notes (Signed)
  [facility] Adult Case Management Discharge Plan :  Will you be returning to the same living situation after discharge:  Yes,  home  At discharge, do you have transportation home?: Yes,  pts grandmother will pick her up Do you have the ability to pay for your medications: Yes,  BCBS insurance  Release of information consent forms completed and in the chart;  Patient to Follow up at: Follow-up Information    Pediatric Psychology Follow up on 04/14/2020.   Why: You have an appointment scheduled with Dr. Clare Gandy for 04/14/20 at 2pm via zoom. Thank You! Contact information: [address] [place], [place] [address] Ph: [phone] Fax: [phone]          Next level of care provider has access to [facility] Link:no  Safety Planning and Suicide Prevention discussed: Yes,  SPE completed with pt as pt declined collateral contact  Have you used any form of tobacco in the last 30 days? (Cigarettes, Smokeless Tobacco, Cigars, and/or Pipes): Yes  Has patient been referred to the Quitline?: Patient refused referral  Patient has been referred for addiction treatment: N/A  Mechele Dawley, LCSW 03/31/2020, 10:03 AM

## 2020-03-31 NOTE — Progress Notes (Signed)
Patient ID: Chelsea Patel, female   DOB: [birth year], 19 y.o.   MRN: [id number]  Discharge Note:  Patient denies SI/HI/AVH at this time. Discharge instructions, AVS, prescriptions, and transition record gone over with patient. Patient agrees to comply with medication management, follow-up visit, and outpatient therapy. Patient belongings returned to patient. Patient questions and concerns addressed and answered. Patient ambulatory off unit. Patient discharged to home with Grandmother.

## 2020-03-31 NOTE — BHH Suicide Risk Assessment (Signed)
[facility] Discharge Suicide Risk Assessment   Principal Problem: Bipolar disorder [facility]) Discharge Diagnoses: Principal Problem:   Bipolar disorder (HCC) Active Problems:   Acute psychosis (HCC)   Total Time spent with patient: 30 minutes  Musculoskeletal: Strength & Muscle Tone: within normal limits Gait & Station: normal Patient leans: N/A  Psychiatric Specialty Exam: Review of Systems  Constitutional: Negative.   HENT: Negative.   Eyes: Negative.   Respiratory: Negative.   Cardiovascular: Negative.   Gastrointestinal: Negative.   Musculoskeletal: Negative.   Skin: Negative.   Neurological: Negative.   Psychiatric/Behavioral: Negative.     Blood pressure 106/72, pulse (!) 54, temperature 98.8 F (37.1 C), temperature source Oral, resp. rate 17, height 5\' 6"  (1.676 m), weight 66.2 kg, SpO2 98 %, unknown if currently breastfeeding.Body mass index is 23.57 kg/m.  General Appearance: Casual  Eye Contact::  Fair  Speech:  Clear and Coherent409  Volume:  Normal  Mood:  Euthymic  Affect:  Congruent  Thought Process:  Goal Directed  Orientation:  Full (Time, Place, and Person)  Thought Content:  Logical  Suicidal Thoughts:  No  Homicidal Thoughts:  No  Memory:  Immediate;   Fair Recent;   Fair Remote;   Fair  Judgement:  Fair  Insight:  Fair  Psychomotor Activity:  Normal  Concentration:  Fair  Recall:  [id number] of Knowledge:Fair  Language: Fair  Akathisia:  No  Handed:  Right  AIMS (if indicated):     Assets:  Desire for Improvement  Sleep:  Number of Hours: 7  Cognition: WNL  ADL's:  Intact   Mental Status Per Nursing Assessment::   On Admission:  NA  Demographic Factors:  Caucasian  Loss Factors: NA  Historical Factors: Impulsivity  Risk Reduction Factors:   Sense of responsibility to family, Living with another person, especially a relative, Positive social support and Positive therapeutic relationship  Continued Clinical Symptoms:  Bipolar  Disorder:   Mixed State  Cognitive Features That Contribute To Risk:  None    Suicide Risk:  Minimal: No identifiable suicidal ideation.  Patients presenting with no risk factors but with morbid ruminations; may be classified as minimal risk based on the severity of the depressive symptoms  Follow-up Information    Pediatric Psychology Follow up on 04/14/2020.   Why: You have an appointment scheduled with Dr. 04/16/2020 for 04/14/20 at 2pm via zoom. Thank You! Contact information: [address] [place], [place] Ph: [phone] Fax: [phone]          Plan Of Care/Follow-up recommendations:  Activity:  Activity as tolerated Diet:  Regular diet Other:  Follow-up with medication and psychiatric follow-up as indicated  [phone], MD 03/31/2020, 11:48 AM

## 2020-03-31 NOTE — Plan of Care (Signed)
  Problem: Coping Skills Goal: STG - Patient will identify 3 positive coping skills strategies to use post d/c within 5 recreation therapy group sessions Description: STG - Patient will identify 3 positive coping skills strategies to use post d/c within 5 recreation therapy group sessions 03/31/2020 1522 by Alveria Apley, LRT Outcome: Adequate for Discharge 03/31/2020 1522 by Alveria Apley, LRT Outcome: Adequate for Discharge

## 2020-03-31 NOTE — Discharge Summary (Signed)
Physician Discharge Summary Note  Patient:  Chelsea Patel is an 19 y.o., female MRN:  [id number] DOB:  [birth year] Patient phone:  [phone] (home)  Patient address:   [address] [place] [address],  Total Time spent with patient: 30 minutes  Date of Admission:  03/27/2020 Date of Discharge: 03/31/20  Reason for Admission:  19yo F with past history of bipolar disorder and cannabis use disorder, who was admitted to [facility] unit for expressing bizarre behavior. Patient presented to ED with agitation and paranoid ideations. Per ED physician note. Patient expressed somatic delusions "like she does not have a body and is just bones" as well as paranoid delusions and ideas of reference "believes that people are manipulating her, and that someone or something is harassing her while she is sleeping".  Principal Problem: Bipolar disorder [facility]) Discharge Diagnoses: Principal Problem:   Bipolar disorder [facility]) Active Problems:   Acute psychosis [facility])   Past Psychiatric History: 2 previous psychiatric admissions 1 in July 2020 and one in February 2021.  Current with [facility].  Diagnosed with bipolar disorder and MDD.  Past Medical History:  Past Medical History:  Diagnosis Date  . Asthma    hx of  . Halitosis   . Rhinitis, allergic   . Tonsillith   . Tonsillitis    chronic    Past Surgical History:  Procedure Laterality Date  . TONSILLECTOMY AND ADENOIDECTOMY N/A 03/22/2017   Procedure: TONSILLECTOMY AND POSSIBLY  ADENOIDECTOMY;  Surgeon: Bud Face, MD;  Location: [facility];  Service: ENT;  Laterality: N/A;  upreg   Family History: History reviewed. No pertinent family history. Family Psychiatric  History: Suicide-parents Social History:  Social History   Substance and Sexual Activity  Alcohol Use No     Social History   Substance and Sexual Activity  Drug Use No    Social History   Socioeconomic History  . Marital status: Single    Spouse name: Not on  file  . Number of children: Not on file  . Years of education: Not on file  . Highest education level: Not on file  Occupational History  . Not on file  Tobacco Use  . Smoking status: Never Smoker  . Smokeless tobacco: Never Used  Substance and Sexual Activity  . Alcohol use: No  . Drug use: No  . Sexual activity: Not on file  Other Topics Concern  . Not on file  Social History Narrative  . Not on file   Social Determinants of Health   Financial Resource Strain:   . Difficulty of Paying Living Expenses:   Food Insecurity:   . Worried About Programme researcher, broadcasting/film/video in the Last Year:   . Barista in the Last Year:   Transportation Needs:   . Freight forwarder (Medical):   Marland Kitchen Lack of Transportation (Non-Medical):   Physical Activity:   . Days of Exercise per Week:   . Minutes of Exercise per Session:   Stress:   . Feeling of Stress :   Social Connections:   . Frequency of Communication with Friends and Family:   . Frequency of Social Gatherings with Friends and Family:   . Attends Religious Services:   . Active Member of Clubs or Organizations:   . Attends Banker Meetings:   Marland Kitchen Marital Status:     Hospital Course:  Patient remained on the [facility] unit for 3 days. The patient stabilized on medication and therapy. Patient was discharged on  Abilify 10 mg daily, Claritin 10 mg daily, Vistaril 25 mg every 6 hours as needed. Patient has shown improvement with improved mood, affect, sleep, appetite, and interaction. Patient has attended group and participated. Patient has been seen in the day room interacting with peers and staff appropriately. Patient denies any SI/HI/AVH and contracts for safety. Patient agrees to follow up at pediatric psychology. Patient is provided with prescriptions for their medications upon discharge.  Also did note patient reported today that she was noncompliant with her medications approximately 1 to 2 weeks prior to this admission.  She did  not have a specific reason for discontinuing her medications.  She reports that she had a very good stay at this hospital and realizes that she needs to continue her medications and likes the Abilify that she is currently taking.  Physical Findings: AIMS:  , ,  ,  ,    CIWA:    COWS:     Musculoskeletal: Strength & Muscle Tone: within normal limits Gait & Station: normal Patient leans: N/A  Psychiatric Specialty Exam: Physical Exam  Nursing note and vitals reviewed. Constitutional: She is oriented to person, place, and time. She appears well-developed and well-nourished.  Respiratory: Effort normal.  Musculoskeletal:        General: Normal range of motion.  Neurological: She is alert and oriented to person, place, and time.  Skin: Skin is warm.    Review of Systems  Constitutional: Negative.   HENT: Negative.   Eyes: Negative.   Respiratory: Negative.   Cardiovascular: Negative.   Gastrointestinal: Negative.   Genitourinary: Negative.   Musculoskeletal: Negative.   Skin: Negative.   Neurological: Negative.   Psychiatric/Behavioral: Negative.     Blood pressure 106/72, pulse (!) 54, temperature 98.8 F (37.1 C), temperature source Oral, resp. rate 17, height 5\' 6"  (1.676 m), weight 66.2 kg, SpO2 98 %, unknown if currently breastfeeding.Body mass index is 23.57 kg/m.  General Appearance: Casual  Eye Contact:  Good  Speech:  Clear and Coherent and Normal Rate  Volume:  Normal  Mood:  Euthymic  Affect:  Congruent  Thought Process:  Coherent and Descriptions of Associations: Intact  Orientation:  Full (Time, Place, and Person)  Thought Content:  WDL  Suicidal Thoughts:  No  Homicidal Thoughts:  No  Memory:  Immediate;   Good Recent;   Good Remote;   Good  Judgement:  Fair  Insight:  Fair  Psychomotor Activity:  Normal  Concentration:  Concentration: Fair  Recall:  [place] of Knowledge:  Good  Language:  Fair  Akathisia:  No  Handed:  Right  AIMS (if  indicated):     Assets:  Communication Skills Desire for Improvement Financial Resources/Insurance Housing Social Support Transportation  ADL's:  Intact  Cognition:  WNL  Sleep:  Number of Hours: 7     Have you used any form of tobacco in the last 30 days? (Cigarettes, Smokeless Tobacco, Cigars, and/or Pipes): Yes  Has this patient used any form of tobacco in the last 30 days? (Cigarettes, Smokeless Tobacco, Cigars, and/or Pipes) Yes, Yes, A prescription for an FDA-approved tobacco cessation medication was offered at discharge and the patient refused  Blood Alcohol level:  Lab Results  Component Value Date   ETH <10 02/72/5366    Metabolic Disorder Labs:  No results found for: HGBA1C, MPG No results found for: PROLACTIN No results found for: CHOL, TRIG, HDL, CHOLHDL, VLDL, LDLCALC  See Psychiatric Specialty Exam and Suicide Risk Assessment completed  by Attending Physician prior to discharge.  Discharge destination:  Home  Is patient on multiple antipsychotic therapies at discharge:  No   Has Patient had three or more failed trials of antipsychotic monotherapy by history:  No  Recommended Plan for Multiple Antipsychotic Therapies: NA  Discharge Instructions    Diet - low sodium heart healthy   Complete by: As directed    Increase activity slowly   Complete by: As directed      Allergies as of 03/31/2020      Reactions   Omnicef [cefdinir] Other (See Comments)   GI upset   Latex Rash      Medication List    STOP taking these medications   escitalopram 10 MG tablet Commonly known as: LEXAPRO   hydrOXYzine 25 MG capsule Commonly known as: VISTARIL   traZODone 50 MG tablet Commonly known as: DESYREL     TAKE these medications     Indication  ARIPiprazole 10 MG tablet Commonly known as: ABILIFY Take 1 tablet (10 mg total) by mouth daily. Start taking on: April 01, 2020  Indication: Bipolar disorder   cetirizine 10 MG tablet Commonly known as:  ZYRTEC Take 10 mg by mouth daily as needed for allergies.  Indication: Hayfever   hydrOXYzine 25 MG tablet Commonly known as: ATARAX/VISTARIL Take 1 tablet (25 mg total) by mouth every 6 (six) hours as needed for anxiety.  Indication: Feeling Anxious   montelukast 10 MG tablet Commonly known as: SINGULAIR Take 10 mg by mouth at bedtime.  Indication: Hayfever   Sprintec 28 0.25-35 MG-MCG tablet Generic drug: norgestimate-ethinyl estradiol Take 1 tablet by mouth daily.  Indication: Birth Control Treatment      Follow-up Information    Pediatric Psychology Follow up on 04/14/2020.   Why: You have an appointment scheduled with Dr. Clare Gandy for 04/14/20 at 2pm via zoom. Thank You! Contact information: [address] [place], [place] [address] Ph: [phone] Fax: [phone]          Follow-up recommendations:  Continue activity as tolerated. Continue diet as recommended by your PCP. Ensure to keep all appointments with outpatient providers.  Comments:  Patient is instructed prior to discharge to: Take all medications as prescribed by his/her mental healthcare provider. Report any adverse effects and or reactions from the medicines to his/her outpatient provider promptly. Patient has been instructed & cautioned: To not engage in alcohol and or illegal drug use while on prescription medicines. In the event of worsening symptoms, patient is instructed to call the crisis hotline, 911 and or go to the nearest ED for appropriate evaluation and treatment of symptoms. To follow-up with his/her primary care provider for your other medical issues, concerns and or health care needs.    Signed: Gerlene Burdock Bray Vickerman, FNP 03/31/2020, 10:16 AM

## 2020-10-28 ENCOUNTER — Ambulatory Visit
Admission: EM | Admit: 2020-10-28 | Discharge: 2020-10-28 | Disposition: A | Discharge status: Home / Self Care | Payer: BC Managed Care – PPO | Attending: Family Medicine | Admitting: Family Medicine

## 2020-10-28 DIAGNOSIS — R319 Hematuria, unspecified: Secondary | ICD-10-CM

## 2020-10-28 DIAGNOSIS — R3915 Urgency of urination: Secondary | ICD-10-CM

## 2020-10-28 DIAGNOSIS — R300 Dysuria: Secondary | ICD-10-CM

## 2020-10-28 DIAGNOSIS — R350 Frequency of micturition: Secondary | ICD-10-CM | POA: Diagnosis present

## 2020-10-28 DIAGNOSIS — N898 Other specified noninflammatory disorders of vagina: Secondary | ICD-10-CM

## 2020-10-28 DIAGNOSIS — N3001 Acute cystitis with hematuria: Secondary | ICD-10-CM

## 2020-10-28 DIAGNOSIS — Z3202 Encounter for pregnancy test, result negative: Secondary | ICD-10-CM | POA: Diagnosis not present

## 2020-10-28 LAB — POCT URINALYSIS DIP (MANUAL ENTRY)
Glucose, UA: NEGATIVE mg/dL
Ketones, POC UA: NEGATIVE mg/dL
Nitrite, UA: NEGATIVE
Protein Ur, POC: 30 mg/dL — AB
Spec Grav, UA: >=1.03 — AB (ref 1.010–1.025)
Urobilinogen, UA: 0.2 E.U./dL
pH, UA: 5.5 (ref 5.0–8.0)

## 2020-10-28 LAB — POCT URINE PREGNANCY: Preg Test, Ur: NEGATIVE

## 2020-10-28 MED ORDER — PHENAZOPYRIDINE HCL 200 MG PO TABS: 200.0000 mg | ORAL_TABLET | Freq: Three times a day (TID) | ORAL | 0 refills | Status: DC

## 2020-10-28 MED ORDER — FLUCONAZOLE 150 MG PO TABS: ORAL_TABLET | ORAL | 0 refills | Status: DC

## 2020-10-28 MED ORDER — NITROFURANTOIN MONOHYD MACRO 100 MG PO CAPS: 100.0000 mg | ORAL_CAPSULE | Freq: Two times a day (BID) | ORAL | 0 refills | Status: DC

## 2020-10-28 NOTE — ED Triage Notes (Signed)
Pt reports having some urinary frequency, dysuria x5days. One day of hematuria and also some vaginal itching.

## 2020-10-28 NOTE — ED Provider Notes (Signed)
[facility]   CC: UTI  SUBJECTIVE:  Chelsea Patel is a 19 y.o. female who complains of urinary frequency, urgency and dysuria for the past 5 days. Reports that she has been taking amoxicillin at home from an old prescription and now she also has a yeast infection from this with no improvement in urinary symptoms. Patient denies a precipitating event, recent sexual encounter, excessive caffeine intake. Localizes the pain to the lower abdomen. Pain is intermittent and describes it as burning. Has not attempted OTC treatment. Symptoms are made worse with urination. Admits to similar symptoms in the past. Denies fever, chills, nausea, vomiting, abdominal pain, flank pain, abnormal vaginal discharge or bleeding  LMP: Patient's last menstrual period was 10/14/2020.  ROS: As in HPI.  All other pertinent ROS negative.     Past Medical History:  Diagnosis Date  . Asthma    hx of  . Halitosis   . Rhinitis, allergic   . Tonsillith   . Tonsillitis    chronic   Past Surgical History:  Procedure Laterality Date  . TONSILLECTOMY AND ADENOIDECTOMY N/A 03/22/2017   Procedure: TONSILLECTOMY AND POSSIBLY  ADENOIDECTOMY;  Surgeon: Bud Face, MD;  Location: [facility];  Service: ENT;  Laterality: N/A;  upreg   Allergies  Allergen Reactions  . Omnicef [Cefdinir] Other (See Comments)    GI upset  . Latex Rash   No current facility-administered medications on file prior to encounter.   Current Outpatient Medications on File Prior to Encounter  Medication Sig Dispense Refill  . ARIPiprazole (ABILIFY) 10 MG tablet Take 1 tablet (10 mg total) by mouth daily. 30 tablet 1  . cetirizine (ZYRTEC) 10 MG tablet Take 10 mg by mouth daily as needed for allergies.     . hydrOXYzine (ATARAX/VISTARIL) 25 MG tablet Take 1 tablet (25 mg total) by mouth every 6 (six) hours as needed for anxiety. 30 tablet 1  . montelukast (SINGULAIR) 10 MG tablet Take 10 mg by mouth at bedtime.    .  SPRINTEC 28 0.25-35 MG-MCG tablet Take 1 tablet by mouth daily.     Social History   Socioeconomic History  . Marital status: Single    Spouse name: Not on file  . Number of children: Not on file  . Years of education: Not on file  . Highest education level: Not on file  Occupational History  . Not on file  Tobacco Use  . Smoking status: Never Smoker  . Smokeless tobacco: Never Used  Vaping Use  . Vaping Use: Every day  Substance and Sexual Activity  . Alcohol use: No  . Drug use: No  . Sexual activity: Not on file  Other Topics Concern  . Not on file  Social History Narrative  . Not on file   Social Determinants of Health   Financial Resource Strain:   . Difficulty of Paying Living Expenses: Not on file  Food Insecurity:   . Worried About Programme researcher, broadcasting/film/video in the Last Year: Not on file  . Ran Out of Food in the Last Year: Not on file  Transportation Needs:   . Lack of Transportation (Medical): Not on file  . Lack of Transportation (Non-Medical): Not on file  Physical Activity:   . Days of Exercise per Week: Not on file  . Minutes of Exercise per Session: Not on file  Stress:   . Feeling of Stress : Not on file  Social Connections:   . Frequency of Communication with  Friends and Family: Not on file  . Frequency of Social Gatherings with Friends and Family: Not on file  . Attends Religious Services: Not on file  . Active Member of Clubs or Organizations: Not on file  . Attends Banker Meetings: Not on file  . Marital Status: Not on file  Intimate Partner Violence:   . Fear of Current or Ex-Partner: Not on file  . Emotionally Abused: Not on file  . Physically Abused: Not on file  . Sexually Abused: Not on file   No family history on file.  OBJECTIVE:  Vitals:   10/28/20 1434  BP: 91/65  Pulse: 81  Resp: 16  Temp: (!) 97.5 F (36.4 C)  TempSrc: Oral  SpO2: 98%  Weight: 150 lb (68 kg)  Height: 5\' 2"  (1.575 m)   General appearance: AOx3  in no acute distress HEENT: NCAT. Oropharynx clear.  Lungs: clear to auscultation bilaterally without adventitious breath sounds Heart: regular rate and rhythm. Radial pulses 2+ symmetrical bilaterally Abdomen: soft; non-distended; suprapubic tenderness; bowel sounds present; no guarding or rebound tenderness Back: no CVA tenderness Extremities: no edema; symmetrical with no gross deformities Skin: warm and dry Neurologic: Ambulates from chair to exam table without difficulty Psychological: alert and cooperative; normal mood and affect  Labs Reviewed  POCT URINALYSIS DIP (MANUAL ENTRY) - Abnormal; Notable for the following components:      Result Value   Clarity, UA cloudy (*)    Bilirubin, UA small (*)    Spec Grav, UA >=1.030 (*)    Blood, UA small (*)    Protein Ur, POC =30 (*)    Leukocytes, UA Trace (*)    All other components within normal limits  URINE CULTURE  POCT URINE PREGNANCY    ASSESSMENT & PLAN:  1. Acute cystitis with hematuria   2. Dysuria   3. Urinary frequency   4. Urinary urgency   5. Vaginal itching   6. Hematuria, unspecified type   7. Negative pregnancy test     Meds ordered this encounter  Medications  . phenazopyridine (PYRIDIUM) 200 MG tablet    Sig: Take 1 tablet (200 mg total) by mouth 3 (three) times daily.    Dispense:  6 tablet    Refill:  0    Order Specific Question:   Supervising Provider    Answer:   Merrilee Jansky  . nitrofurantoin, macrocrystal-monohydrate, (MACROBID) 100 MG capsule    Sig: Take 1 capsule (100 mg total) by mouth 2 (two) times daily.    Dispense:  10 capsule    Refill:  0    Order Specific Question:   Supervising Provider    Answer:   [id number] Merrilee Jansky  . fluconazole (DIFLUCAN) 150 MG tablet    Sig: Take one tablet at the onset of symptoms. If symptoms are still present 3 days later, take the second tablet.    Dispense:  2 tablet    Refill:  0    Order Specific Question:   Supervising  Provider    Answer:   [id number] Merrilee Jansky   Prescribed Macrobid Prescribed pyridium Prescribed fluconazole Urine culture sent  We will call you with abnormal results that need further treatment Push fluids and get plenty of rest Take antibiotic as directed and to completion Take pyridium as prescribed and as needed for symptomatic relief Follow up with PCP if symptoms persists Return here or go to ER if you have any new or worsening  symptoms such as fever, worsening abdominal pain, nausea/vomiting, flank pain  Outlined signs and symptoms indicating need for more acute intervention Patient verbalized understanding After Visit Summary given     Moshe Cipro, NP 10/28/20 1455

## 2020-10-28 NOTE — Discharge Instructions (Addendum)
You may have a urinary tract infection.   Urine pregnancy was negative in office today  I have prescribed Macrobid for you to take twice a day for 5 days  I have sent in fluconazole in case of yeast. Take one tablet at the onset of symptoms. If symptoms are still present in 3 days, take the second tablet.   We are going to culture your urine and will call you as soon as we have the results.   Drink plenty of water, 8-10 glasses per day.   Follow up with your primary care provider as needed.   Go to the Emergency Department if you experience severe pain, shortness of breath, high fever, or other concerns.

## 2020-10-30 LAB — URINE CULTURE: Culture: NO GROWTH

## 2021-01-02 ENCOUNTER — Other Ambulatory Visit: Payer: BC Managed Care – PPO

## 2021-04-26 ENCOUNTER — Encounter: Payer: Self-pay | Admitting: Emergency Medicine

## 2021-04-26 ENCOUNTER — Other Ambulatory Visit: Payer: Self-pay

## 2021-04-26 ENCOUNTER — Emergency Department
Admission: EM | Admit: 2021-04-26 | Discharge: 2021-04-27 | Disposition: A | Discharge status: Other Acute Inpatient Hospital | Payer: Self-pay | Attending: Emergency Medicine | Admitting: Emergency Medicine

## 2021-04-26 ENCOUNTER — Emergency Department
Admission: EM | Admit: 2021-04-26 | Discharge: 2021-04-26 | Disposition: A | Discharge status: Home / Self Care | Payer: BC Managed Care – PPO | Attending: Emergency Medicine | Admitting: Emergency Medicine

## 2021-04-26 DIAGNOSIS — J45909 Unspecified asthma, uncomplicated: Secondary | ICD-10-CM | POA: Insufficient documentation

## 2021-04-26 DIAGNOSIS — Z91040 Latex allergy status: Secondary | ICD-10-CM | POA: Insufficient documentation

## 2021-04-26 DIAGNOSIS — F69 Unspecified disorder of adult personality and behavior: Secondary | ICD-10-CM | POA: Insufficient documentation

## 2021-04-26 DIAGNOSIS — Z046 Encounter for general psychiatric examination, requested by authority: Secondary | ICD-10-CM | POA: Insufficient documentation

## 2021-04-26 DIAGNOSIS — R4689 Other symptoms and signs involving appearance and behavior: Secondary | ICD-10-CM

## 2021-04-26 DIAGNOSIS — Z20822 Contact with and (suspected) exposure to covid-19: Secondary | ICD-10-CM | POA: Insufficient documentation

## 2021-04-26 DIAGNOSIS — F312 Bipolar disorder, current episode manic severe with psychotic features: Secondary | ICD-10-CM

## 2021-04-26 DIAGNOSIS — Z760 Encounter for issue of repeat prescription: Secondary | ICD-10-CM | POA: Insufficient documentation

## 2021-04-26 DIAGNOSIS — Z7951 Long term (current) use of inhaled steroids: Secondary | ICD-10-CM | POA: Insufficient documentation

## 2021-04-26 DIAGNOSIS — F1210 Cannabis abuse, uncomplicated: Secondary | ICD-10-CM

## 2021-04-26 LAB — COMPREHENSIVE METABOLIC PANEL
ALT: 35 U/L (ref 0–44)
AST: 66 U/L — ABNORMAL HIGH (ref 15–41)
Albumin: 4.8 g/dL (ref 3.5–5.0)
Alkaline Phosphatase: 69 U/L (ref 38–126)
Anion gap: 12 (ref 5–15)
BUN: 11 mg/dL (ref 6–20)
CO2: 21 mmol/L — ABNORMAL LOW (ref 22–32)
Calcium: 9.8 mg/dL (ref 8.9–10.3)
Chloride: 105 mmol/L (ref 98–111)
Creatinine, Ser: 0.9 mg/dL (ref 0.44–1.00)
GFR, Estimated: >60 mL/min (ref >60)
Glucose, Bld: 87 mg/dL (ref 70–99)
Potassium: 3.9 mmol/L (ref 3.5–5.1)
Sodium: 138 mmol/L (ref 135–145)
Total Bilirubin: 1.6 mg/dL — ABNORMAL HIGH (ref 0.3–1.2)
Total Protein: 8.2 g/dL — ABNORMAL HIGH (ref 6.5–8.1)

## 2021-04-26 LAB — CBC
HCT: 40.1 % (ref 36.0–46.0)
Hemoglobin: 13.8 g/dL (ref 12.0–15.0)
MCH: 29.9 pg (ref 26.0–34.0)
MCHC: 34.4 g/dL (ref 30.0–36.0)
MCV: 87 fL (ref 80.0–100.0)
Platelets: 399 10*3/uL (ref 150–400)
RBC: 4.61 MIL/uL (ref 3.87–5.11)
RDW: 13 % (ref 11.5–15.5)
WBC: 12.6 10*3/uL — ABNORMAL HIGH (ref 4.0–10.5)
nRBC: 0 % (ref 0.0–0.2)

## 2021-04-26 LAB — URINE DRUG SCREEN, QUALITATIVE (ARMC ONLY)
Amphetamines, Ur Screen: NOT DETECTED
Barbiturates, Ur Screen: NOT DETECTED
Benzodiazepine, Ur Scrn: NOT DETECTED
Cannabinoid 50 Ng, Ur ~~LOC~~: POSITIVE — AB
Cocaine Metabolite,Ur ~~LOC~~: NOT DETECTED
MDMA (Ecstasy)Ur Screen: NOT DETECTED
Methadone Scn, Ur: NOT DETECTED
Opiate, Ur Screen: NOT DETECTED
Phencyclidine (PCP) Ur S: NOT DETECTED
Tricyclic, Ur Screen: NOT DETECTED

## 2021-04-26 LAB — RESP PANEL BY RT-PCR (FLU A&B, COVID) ARPGX2
Influenza A by PCR: NEGATIVE
Influenza B by PCR: NEGATIVE
SARS Coronavirus 2 by RT PCR: NEGATIVE

## 2021-04-26 LAB — SALICYLATE LEVEL: Salicylate Lvl: <7 mg/dL — ABNORMAL LOW (ref 7.0–30.0)

## 2021-04-26 LAB — TSH: TSH: 1.649 u[IU]/mL (ref 0.350–4.500)

## 2021-04-26 LAB — ACETAMINOPHEN LEVEL: Acetaminophen (Tylenol), Serum: <10 ug/mL — ABNORMAL LOW (ref 10–30)

## 2021-04-26 LAB — ETHANOL: Alcohol, Ethyl (B): <10 mg/dL (ref <10)

## 2021-04-26 LAB — POC URINE PREG, ED: Preg Test, Ur: NEGATIVE

## 2021-04-26 MED ORDER — ALBUTEROL SULFATE HFA 108 (90 BASE) MCG/ACT IN AERS: 2.0000 | INHALATION_SPRAY | Freq: Four times a day (QID) | RESPIRATORY_TRACT | 2 refills | Status: DC | PRN

## 2021-04-26 MED ORDER — ARIPIPRAZOLE 10 MG PO TABS
10.0000 mg | ORAL_TABLET | Freq: Every day | ORAL | Status: DC
  Administered 2021-04-26 – 2021-04-27 (×2): 10 mg via ORAL
  Filled 2021-04-26 (×2): qty 1

## 2021-04-26 MED ORDER — HALOPERIDOL LACTATE 5 MG/ML IJ SOLN
5.0000 mg | Freq: Once | INTRAMUSCULAR | Status: AC
  Administered 2021-04-26: 5 mg via INTRAMUSCULAR
  Filled 2021-04-26: qty 1

## 2021-04-26 MED ORDER — DIPHENHYDRAMINE HCL 50 MG/ML IJ SOLN
50.0000 mg | Freq: Once | INTRAMUSCULAR | Status: AC
  Administered 2021-04-26: 50 mg via INTRAMUSCULAR
  Filled 2021-04-26: qty 1

## 2021-04-26 MED ORDER — NICOTINE 21 MG/24HR TD PT24
21.0000 mg | MEDICATED_PATCH | Freq: Every day | TRANSDERMAL | Status: DC
  Administered 2021-04-26 – 2021-04-27 (×2): 21 mg via TRANSDERMAL
  Filled 2021-04-26 (×2): qty 1

## 2021-04-26 MED ORDER — FLUTICASONE PROPIONATE HFA 44 MCG/ACT IN AERO: 2.0000 | INHALATION_SPRAY | Freq: Two times a day (BID) | RESPIRATORY_TRACT | 2 refills | Status: DC

## 2021-04-26 MED ORDER — LORAZEPAM 2 MG/ML IJ SOLN
2.0000 mg | Freq: Once | INTRAMUSCULAR | Status: AC
  Administered 2021-04-26: 2 mg via INTRAMUSCULAR
  Filled 2021-04-26: qty 1

## 2021-04-26 NOTE — ED Provider Notes (Signed)
[facility] Emergency Department Provider Note   ____________________________________________    I have reviewed the triage vital signs and the nursing notes.   HISTORY  Chief Complaint Medication Refill     HPI Chelsea Patel is a 20 y.o. female with a history of bipolar disorder who presents for medication refill.  Patient tells me that she is out of her asthma medication and would like this refilled.  No other complaints reported  Past Medical History:  Diagnosis Date  . Asthma    hx of  . Halitosis   . Rhinitis, allergic   . Tonsillith   . Tonsillitis    chronic    Patient Active Problem List   Diagnosis Date Noted  . Bipolar affective disorder, manic, severe, with psychotic behavior (HCC) 04/26/2021  . Cannabis abuse 04/26/2021  . Bipolar disorder (HCC) 03/30/2020  . Delusions (HCC) 07/07/2019  . Paranoia (HCC) 07/07/2019  . Acute psychosis (HCC) 07/07/2019    Past Surgical History:  Procedure Laterality Date  . TONSILLECTOMY AND ADENOIDECTOMY N/A 03/22/2017   Procedure: TONSILLECTOMY AND POSSIBLY  ADENOIDECTOMY;  Surgeon: Bud Face, MD;  Location: [facility];  Service: ENT;  Laterality: N/A;  upreg    Prior to Admission medications   Medication Sig Start Date End Date Taking? Authorizing Provider  albuterol (VENTOLIN HFA) 108 (90 Base) MCG/ACT inhaler Inhale 2 puffs into the lungs every 6 (six) hours as needed for wheezing or shortness of breath. 04/26/21  Yes Jene Every, MD  fluticasone (FLOVENT HFA) 44 MCG/ACT inhaler Inhale 2 puffs into the lungs 2 (two) times daily. 04/26/21 04/26/22 Yes Jene Every, MD  ARIPiprazole (ABILIFY) 10 MG tablet Take 1 tablet (10 mg total) by mouth daily. 04/01/20   Money, Gerlene Burdock, FNP  cetirizine (ZYRTEC) 10 MG tablet Take 10 mg by mouth daily as needed for allergies.     [provider]  fluconazole (DIFLUCAN) 150 MG tablet Take one tablet at the onset of symptoms. If  symptoms are still present 3 days later, take the second tablet. 10/28/20   Moshe Cipro, NP  hydrOXYzine (ATARAX/VISTARIL) 25 MG tablet Take 1 tablet (25 mg total) by mouth every 6 (six) hours as needed for anxiety. 03/31/20   Money, Gerlene Burdock, FNP  montelukast (SINGULAIR) 10 MG tablet Take 10 mg by mouth at bedtime.    [provider]  nitrofurantoin, macrocrystal-monohydrate, (MACROBID) 100 MG capsule Take 1 capsule (100 mg total) by mouth 2 (two) times daily. 10/28/20   Moshe Cipro, NP  phenazopyridine (PYRIDIUM) 200 MG tablet Take 1 tablet (200 mg total) by mouth 3 (three) times daily. 10/28/20   Moshe Cipro, NP  SPRINTEC 28 0.25-35 MG-MCG tablet Take 1 tablet by mouth daily. 03/18/20   [provider]     Allergies Omnicef [cefdinir] and Latex  History reviewed. No pertinent family history.  Social History Social History   Tobacco Use  . Smoking status: Never Smoker  . Smokeless tobacco: Never Used  Vaping Use  . Vaping Use: Every day  Substance Use Topics  . Alcohol use: No  . Drug use: No    Review of Systems  Constitutional: No fever/chills     Gastrointestinal: No abdominal pain.   Genitourinary: Negative for dysuria.   Neurological: Negative for headaches     ____________________________________________   PHYSICAL EXAM:  VITAL SIGNS: ED Triage Vitals  Enc Vitals Group     BP 04/26/21 1308 (!) 141/90     Pulse Rate  04/26/21 1308 (!) 103     Resp 04/26/21 1308 20     Temp 04/26/21 1308 98.9 F (37.2 C)     Temp Source 04/26/21 1308 Oral     SpO2 04/26/21 1308 98 %     Weight 04/26/21 1303 65.8 kg (145 lb)     Height 04/26/21 1303 1.575 m (5\' 2" )     Head Circumference --      Peak Flow --      Pain Score 04/26/21 1303 8     Pain Loc --      Pain Edu? --      Excl. in GC? --      Constitutional: Alert and oriented.    Cardiovascular: Normal rate, regular rhythm.  Respiratory: Normal respiratory effort.   No retractions.  Clear to auscultation bilaterally  .   Neurologic:  Normal speech and language. No gross focal neurologic deficits are appreciated.   Skin:  Skin is warm, dry and intact. No rash noted.   ____________________________________________   LABS (all labs ordered are listed, but only abnormal results are displayed)  Labs Reviewed - No data to display ____________________________________________  EKG   ____________________________________________  RADIOLOGY  None ____________________________________________   PROCEDURES  Procedure(s) performed: No  Procedures   Critical Care performed: No ____________________________________________   INITIAL IMPRESSION / ASSESSMENT AND PLAN / ED COURSE  Pertinent labs & imaging results that were available during my care of the patient were reviewed by me and considered in my medical decision making (see chart for details).  Patient is here primarily for medication refill, she denies any psychiatric complaints at this time and appears to be at her baseline.  ____________________________________________   FINAL CLINICAL IMPRESSION(S) / ED DIAGNOSES  Final diagnoses:  Medication refill      NEW MEDICATIONS STARTED DURING THIS VISIT:  Discharge Medication List as of 04/26/2021  1:40 PM    START taking these medications   Details  albuterol (VENTOLIN HFA) 108 (90 Base) MCG/ACT inhaler Inhale 2 puffs into the lungs every 6 (six) hours as needed for wheezing or shortness of breath., Starting Mon 04/26/2021, Normal    fluticasone (FLOVENT HFA) 44 MCG/ACT inhaler Inhale 2 puffs into the lungs 2 (two) times daily., Starting Mon 04/26/2021, Until Tue 04/26/2022, Normal         Note:  This document was prepared using Dragon voice recognition software and may include unintentional dictation errors.   06/26/2022, MD 04/26/21 2036

## 2021-04-26 NOTE — ED Notes (Signed)
Patient given sandwich tray and juice per request.

## 2021-04-26 NOTE — ED Triage Notes (Signed)
Pt states stopped taking her asthma and allergy medications 3 years ago. Pt to desk repeatedly stating that she might be pregnant, states stopped her birth control medication 2 yrs ago. Pt states she wants to get back on birth control and asthma medications.

## 2021-04-26 NOTE — BH Assessment (Signed)
Referral information for Psychiatric Hospitalization faxed to;   [facility] [phone]),   . [facility] ([phone]---[phone]),  . [facility] [phone]),   . [facility] [phone] -or- [phone]),   . [facility] [phone]- [phone])  . [facility] [phone]).  [facility] [phone])

## 2021-04-26 NOTE — ED Notes (Signed)
Pt seen by dr Toni Amend

## 2021-04-26 NOTE — ED Notes (Signed)
ekg and meds given.

## 2021-04-26 NOTE — ED Provider Notes (Signed)
[facility] Emergency Department Provider Note  ____________________________________________   Event Date/Time   First MD Initiated Contact with Patient 04/26/21 1714     (approximate)  I have reviewed the triage vital signs and the nursing notes.   HISTORY  Chief Complaint Psychiatric Evaluation   HPI Chelsea Patel is a 20 y.o. female with a past medical history of asthma, bipolar disorder and paranoid psychosis who presents accompanied by police after IVC paperwork was filled out by her aunt.  Patient was reportedly seen earlier today although left and has been acting bizarrely since then showing up in places where she previously worked but no longer works and not making sense to family.  She was also found at the [facility] and was seen speaking nonsensically to family.  On arrival here patient states she did go to these places but is unable to clarify why.  She states she think she needs her thyroid tested and a pregnancy test as she recently had some unprotected sex and wants to know if she is pregnant.  She denies any SI HI or hallucinations.  She denies taking any illegal drugs or alcohol or today.  She she has been compliant with her Abilify.  She states she does think she needs to see a psychiatrist although is unable to clarify why.  She denies any acute physical complaints including headache, earache, sore throat, nausea, vomiting, diarrhea, dysuria, rash or recent injuries or falls.         Past Medical History:  Diagnosis Date  . Asthma    hx of  . Halitosis   . Rhinitis, allergic   . Tonsillith   . Tonsillitis    chronic    Patient Active Problem List   Diagnosis Date Noted  . Bipolar disorder (HCC) 03/30/2020  . Delusions (HCC) 07/07/2019  . Paranoia (HCC) 07/07/2019  . Acute psychosis (HCC) 07/07/2019    Past Surgical History:  Procedure Laterality Date  . TONSILLECTOMY AND ADENOIDECTOMY N/A 03/22/2017   Procedure: TONSILLECTOMY  AND POSSIBLY  ADENOIDECTOMY;  Surgeon: Bud Face, MD;  Location: [facility];  Service: ENT;  Laterality: N/A;  upreg    Prior to Admission medications   Medication Sig Start Date End Date Taking? Authorizing Provider  albuterol (VENTOLIN HFA) 108 (90 Base) MCG/ACT inhaler Inhale 2 puffs into the lungs every 6 (six) hours as needed for wheezing or shortness of breath. 04/26/21   Jene Every, MD  ARIPiprazole (ABILIFY) 10 MG tablet Take 1 tablet (10 mg total) by mouth daily. 04/01/20   Money, Gerlene Burdock, FNP  cetirizine (ZYRTEC) 10 MG tablet Take 10 mg by mouth daily as needed for allergies.     [provider]  fluconazole (DIFLUCAN) 150 MG tablet Take one tablet at the onset of symptoms. If symptoms are still present 3 days later, take the second tablet. 10/28/20   Moshe Cipro, NP  fluticasone (FLOVENT HFA) 44 MCG/ACT inhaler Inhale 2 puffs into the lungs 2 (two) times daily. 04/26/21 04/26/22  Jene Every, MD  hydrOXYzine (ATARAX/VISTARIL) 25 MG tablet Take 1 tablet (25 mg total) by mouth every 6 (six) hours as needed for anxiety. 03/31/20   Money, Gerlene Burdock, FNP  montelukast (SINGULAIR) 10 MG tablet Take 10 mg by mouth at bedtime.    [provider]  nitrofurantoin, macrocrystal-monohydrate, (MACROBID) 100 MG capsule Take 1 capsule (100 mg total) by mouth 2 (two) times daily. 10/28/20   Moshe Cipro, NP  phenazopyridine (PYRIDIUM) 200 MG tablet  Take 1 tablet (200 mg total) by mouth 3 (three) times daily. 10/28/20   Moshe Cipro, NP  SPRINTEC 28 0.25-35 MG-MCG tablet Take 1 tablet by mouth daily. 03/18/20   [provider]    Allergies Omnicef [cefdinir] and Latex  History reviewed. No pertinent family history.  Social History Social History   Tobacco Use  . Smoking status: Never Smoker  . Smokeless tobacco: Never Used  Vaping Use  . Vaping Use: Every day  Substance Use Topics  . Alcohol use: No  . Drug use: No    Review of  Systems  Review of Systems  Constitutional: Negative for chills and fever.  HENT: Negative for sore throat.   Eyes: Negative for pain.  Respiratory: Negative for cough and stridor.   Cardiovascular: Negative for chest pain.  Gastrointestinal: Negative for vomiting.  Genitourinary: Negative for dysuria.  Musculoskeletal: Negative for myalgias.  Skin: Negative for rash.  Neurological: Negative for seizures, loss of consciousness and headaches.  Psychiatric/Behavioral: Negative for hallucinations and suicidal ideas.  All other systems reviewed and are negative.     ____________________________________________   PHYSICAL EXAM:  VITAL SIGNS: ED Triage Vitals  Enc Vitals Group     BP 04/26/21 1708 125/82     Pulse Rate 04/26/21 1708 97     Resp 04/26/21 1708 20     Temp 04/26/21 1708 98.4 F (36.9 C)     Temp Source 04/26/21 1708 Oral     SpO2 04/26/21 1708 100 %     Weight 04/26/21 1709 145 lb (65.8 kg)     Height 04/26/21 1709 5\' 2"  (1.575 m)     Head Circumference --      Peak Flow --      Pain Score 04/26/21 1709 0     Pain Loc --      Pain Edu? --      Excl. in GC? --    Vitals:   04/26/21 1708  BP: 125/82  Pulse: 97  Resp: 20  Temp: 98.4 F (36.9 C)  SpO2: 100%   Physical Exam Vitals and nursing note reviewed.  Constitutional:      General: She is not in acute distress.    Appearance: She is well-developed.  HENT:     Head: Normocephalic and atraumatic.     Right Ear: External ear normal.     Left Ear: External ear normal.     Nose: Nose normal.     Mouth/Throat:     Mouth: Mucous membranes are moist.  Eyes:     Conjunctiva/sclera: Conjunctivae normal.  Cardiovascular:     Rate and Rhythm: Normal rate and regular rhythm.     Heart sounds: No murmur heard.   Pulmonary:     Effort: Pulmonary effort is normal. No respiratory distress.     Breath sounds: Normal breath sounds.  Abdominal:     Palpations: Abdomen is soft.     Tenderness: There is  no abdominal tenderness.  Musculoskeletal:     Cervical back: Neck supple.  Skin:    General: Skin is warm and dry.     Capillary Refill: Capillary refill takes less than 2 seconds.  Neurological:     Mental Status: She is alert and oriented to person, place, and time.  Psychiatric:        Speech: She is noncommunicative. Speech is tangential.        Thought Content: Thought content does not include homicidal or suicidal ideation.  Cognition and Memory: She exhibits impaired recent memory.      ____________________________________________   LABS (all labs ordered are listed, but only abnormal results are displayed)  Labs Reviewed  RESP PANEL BY RT-PCR (FLU A&B, COVID) ARPGX2  COMPREHENSIVE METABOLIC PANEL  ETHANOL  SALICYLATE LEVEL  ACETAMINOPHEN LEVEL  CBC  URINE DRUG SCREEN, QUALITATIVE ([facility] ONLY)  POC URINE PREG, ED   ____________________________________________  EKG  ____________________________________________  RADIOLOGY  ED MD interpretation:    Official radiology report(s): No results found.  ____________________________________________   PROCEDURES  Procedure(s) performed (including Critical Care):  Procedures   ____________________________________________   INITIAL IMPRESSION / ASSESSMENT AND PLAN / ED COURSE      Patient presents with above-stated history and exam for assessment of some bizarre behavior after IVC paperwork was filled out by family.  On arrival patient is somewhat disorganized initial interview very tangential but denies any acute physical complaints SI HI recent illicit drug use.  She think she needs psychiatrist but not sure why.  Low suspicion for contributing acute organic etiology although we will send routine psych screening labs.  As requested we will also send pregnancy test.  TTS and psychiatry consulted.  IVC paperwork completed by this provider with concerns for decompensated bipolar disorder.  The patient has  been placed in psychiatric observation due to the need to provide a safe environment for the patient while obtaining psychiatric consultation and evaluation, as well as ongoing medical and medication management to treat the patient's condition.  The patient has been placed under full IVC at this time.         ____________________________________________   FINAL CLINICAL IMPRESSION(S) / ED DIAGNOSES  Final diagnoses:  Behavior concern    Medications - No data to display   ED Discharge Orders    None       Note:  This document was prepared using Dragon voice recognition software and may include unintentional dictation errors.   Gilles Chiquito, MD 04/26/21 1726

## 2021-04-26 NOTE — ED Notes (Signed)
Patient knocking on door and yelling for Brad.  Patient redirected to go to her room.  As this RN was leaving another patient room, she is yelling at another patient when Engineer, materials intervened patient states "I don't like black people, cause that is how I was raised."  Explained that he was another patient and that safety for all patients is the priority. Patient again redirected to to go her room.

## 2021-04-26 NOTE — ED Notes (Addendum)
Pt belongings:  Jeans Hat Gold necklace Pink shirt Green bra Hair bands Jewelry (belly ring, multiple earrings) in urine container in back pack 4 rings Multiple bracelets Black backpack Green stuffed animal

## 2021-04-26 NOTE — ED Notes (Signed)
Patient observed on camera putting chair on bed and climbing up on it and trying to reach the ceiling.  Staff into room and instructed patient to get off bed, chair removed from room.

## 2021-04-26 NOTE — ED Triage Notes (Signed)
Pt to ED via [employer], per [employer] officer IVC papers en route to ED. Pt's aunt states concerns regarding abnormal behavioral. Pt's aunt reports that patient showed up at previous barn where patient is unwelcome currently earlier today, pt's aunt reports that patient may or may not be taking her medications. Pt seen and discharged earlier today and walked out of hospital without notifying her aunt. [place] [employer] officer [place]

## 2021-04-26 NOTE — ED Notes (Addendum)
Pt brought to [facility] by police officer.  Pt irritable and crying.  Pt reports she walked out of [facility] today and was detained in front of a tattoo parlor in [place].  Pt is IVC.   Pt irritable and states she is bipolar.  Pt not answering this rn's questions and wishes to be left alone at this time.  Pt informed of video surveillance on unit and told to let staff know if she has any needs. Pt alert.

## 2021-04-26 NOTE — Consult Note (Signed)
[facility] Face-to-Face Psychiatry Consult   Reason for Consult: Consult for 20 year old woman with a history of mental illness came into the hospital with bizarre behavior under IVC Referring Physician: Katrinka Blazing Patient Identification: Chelsea Patel MRN:  [id number] Principal Diagnosis: Bipolar affective disorder, manic, severe, with psychotic behavior (HCC) Diagnosis:  Principal Problem:   Bipolar affective disorder, manic, severe, with psychotic behavior (HCC) Active Problems:   Cannabis abuse   Total Time spent with patient: 1 hour  Subjective:   Chelsea Patel is a 20 y.o. female patient admitted with "what is it look like?  I need some damn medicine!".  HPI: Patient seen chart reviewed.  20 year old woman with a history of mental health problems.  She had been by the emergency room earlier today with some vague concerns but came back under IVC filed by the family reporting bizarre paranoid behavior.  Patient is disorganized although she did cooperate to some extent with talking with me.  She said that she needs a different medicine because she is having flashbacks all day.  I asked her what she means by that and she says she has flashbacks of her parents fan life.  Admits that she is not sleeping well.  Mood feels up and down disorganized.  Patient is disorganized in her speech frequently going off on tangents or answering with non sequiturs.  She says she is still prescribed Abilify but takes it no more than every other day.  She says she uses marijuana heavily every day and smokes about a half a pack a day.  Also drinking on the weekend denies any drug use.  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  Past Psychiatric History: History of mental health problems for the past couple years.  Previous diagnosis of bipolar disorder.  Certainly at this age could turn out to be bipolar or schizophrenic or schizoaffective disorder.  Has been treated with  Abilify in the past but with compliance issues.  No history of suicide attempts identified  Risk to Self:   Risk to Others:   Prior Inpatient Therapy:   Prior Outpatient Therapy:    Past Medical History:  Past Medical History:  Diagnosis Date  . Asthma    hx of  . Halitosis   . Rhinitis, allergic   . Tonsillith   . Tonsillitis    chronic    Past Surgical History:  Procedure Laterality Date  . TONSILLECTOMY AND ADENOIDECTOMY N/A 03/22/2017   Procedure: TONSILLECTOMY AND POSSIBLY  ADENOIDECTOMY;  Surgeon: Bud Face, MD;  Location: [facility];  Service: ENT;  Laterality: N/A;  upreg   Family History: History reviewed. No pertinent family history. Family Psychiatric  History: None reported Social History:  Social History   Substance and Sexual Activity  Alcohol Use No     Social History   Substance and Sexual Activity  Drug Use No    Social History   Socioeconomic History  . Marital status: Single    Spouse name: Not on file  . Number of children: Not on file  . Years of education: Not on file  . Highest education level: Not on file  Occupational History  . Not on file  Tobacco Use  . Smoking status: Never Smoker  . Smokeless tobacco: Never Used  Vaping Use  . Vaping Use: Every day  Substance and Sexual Activity  . Alcohol use: No  . Drug use: No  . Sexual activity: Not on file  Other Topics Concern  . Not on file  Social History Narrative  . Not on file   Social Determinants of Health   Financial Resource Strain: Not on file  Food Insecurity: Not on file  Transportation Needs: Not on file  Physical Activity: Not on file  Stress: Not on file  Social Connections: Not on file   Additional Social History:    Allergies:   Allergies  Allergen Reactions  . Omnicef [Cefdinir] Other (See Comments)    GI upset  . Latex Rash    Labs:  Results for orders placed or performed during the hospital encounter of 04/26/21 (from the past 48  hour(s))  Comprehensive metabolic panel     Status: Abnormal   Collection Time: 04/26/21  5:11 PM  Result Value Ref Range   Sodium 138 135 - 145 mmol/L   Potassium 3.9 3.5 - 5.1 mmol/L   Chloride 105 98 - 111 mmol/L   CO2 21 (L) 22 - 32 mmol/L   Glucose, Bld 87 70 - 99 mg/dL    Comment: Glucose reference range applies only to samples taken after fasting for at least 8 hours.   BUN 11 6 - 20 mg/dL   Creatinine, Ser 3.66 0.44 - 1.00 mg/dL   Calcium 9.8 8.9 - 44.0 mg/dL   Total Protein 8.2 (H) 6.5 - 8.1 g/dL   Albumin 4.8 3.5 - 5.0 g/dL   AST 66 (H) 15 - 41 U/L   ALT 35 0 - 44 U/L   Alkaline Phosphatase 69 38 - 126 U/L   Total Bilirubin 1.6 (H) 0.3 - 1.2 mg/dL   GFR, Estimated >34 >74 mL/min    Comment: (NOTE) Calculated using the CKD-EPI Creatinine Equation (2021)    Anion gap 12 5 - 15    Comment: Performed at [facility], [address] [place], [place] [address]  Ethanol     Status: None   Collection Time: 04/26/21  5:11 PM  Result Value Ref Range   Alcohol, Ethyl (B) <10 <10 mg/dL    Comment: (NOTE) Lowest detectable limit for serum alcohol is 10 mg/dL.  For medical purposes only. Performed at [facility], [address] [place], [place] [address]   Salicylate level     Status: Abnormal   Collection Time: 04/26/21  5:11 PM  Result Value Ref Range   Salicylate Lvl <7.0 (L) 7.0 - 30.0 mg/dL    Comment: Performed at [facility], [address] [place], [place] [address]  Acetaminophen level     Status: Abnormal   Collection Time: 04/26/21  5:11 PM  Result Value Ref Range   Acetaminophen (Tylenol), Serum <10 (L) 10 - 30 ug/mL    Comment: (NOTE) Therapeutic concentrations vary significantly. A range of 10-30 ug/mL  may be an effective concentration for many patients. However, some  are best treated at concentrations outside of this range. Acetaminophen concentrations >150 ug/mL at 4 hours after ingestion  and >50 ug/mL at 12 hours  after ingestion are often associated with  toxic reactions.  Performed at [facility], [address] [place], [place] [address]   cbc     Status: Abnormal   Collection Time: 04/26/21  5:11 PM  Result Value Ref Range   WBC 12.6 (H) 4.0 - 10.5 K/uL   RBC 4.61 3.87 - 5.11 MIL/uL   Hemoglobin 13.8 12.0 - 15.0 g/dL   HCT 16.6 06.3 - 01.6 %   MCV 87.0 80.0 - 100.0 fL  MCH 29.9 26.0 - 34.0 pg   MCHC 34.4 30.0 - 36.0 g/dL   RDW 65.7 84.6 - 96.2 %   Platelets 399 150 - 400 K/uL   nRBC 0.0 0.0 - 0.2 %    Comment: Performed at [facility], [address] [place], [place] [address]  Resp Panel by RT-PCR (Flu A&B, Covid) Nasopharyngeal Swab     Status: None   Collection Time: 04/26/21  5:23 PM   Specimen: Nasopharyngeal Swab; Nasopharyngeal(NP) swabs in vial transport medium  Result Value Ref Range   SARS Coronavirus 2 by RT PCR NEGATIVE NEGATIVE    Comment: (NOTE) SARS-CoV-2 target nucleic acids are NOT DETECTED.  The SARS-CoV-2 RNA is generally detectable in upper respiratory specimens during the acute phase of infection. The lowest concentration of SARS-CoV-2 viral copies this assay can detect is 138 copies/mL. A negative result does not preclude SARS-Cov-2 infection and should not be used as the sole basis for treatment or other patient management decisions. A negative result may occur with  improper specimen collection/handling, submission of specimen other than nasopharyngeal swab, presence of viral mutation(s) within the areas targeted by this assay, and inadequate number of viral copies(<138 copies/mL). A negative result must be combined with clinical observations, patient history, and epidemiological information. The expected result is Negative.  Fact Sheet for Patients:  BloggerCourse.com  Fact Sheet for Healthcare Providers:  SeriousBroker.it  This test is no t yet approved or cleared by the [place] FDA and  has been authorized for detection and/or diagnosis of SARS-CoV-2 by FDA under an Emergency Use Authorization (EUA). This EUA will remain  in effect (meaning this test can be used) for the duration of the COVID-19 declaration under Section 564(b)(1) of the Act, 21 U.S.C.section 360bbb-3(b)(1), unless the authorization is terminated  or revoked sooner.       Influenza A by PCR NEGATIVE NEGATIVE   Influenza B by PCR NEGATIVE NEGATIVE    Comment: (NOTE) The Xpert Xpress SARS-CoV-2/FLU/RSV plus assay is intended as an aid in the diagnosis of influenza from Nasopharyngeal swab specimens and should not be used as a sole basis for treatment. Nasal washings and aspirates are unacceptable for Xpert Xpress SARS-CoV-2/FLU/RSV testing.  Fact Sheet for Patients: BloggerCourse.com  Fact Sheet for Healthcare Providers: SeriousBroker.it  This test is not yet approved or cleared by the [place] FDA and has been authorized for detection and/or diagnosis of SARS-CoV-2 by FDA under an Emergency Use Authorization (EUA). This EUA will remain in effect (meaning this test can be used) for the duration of the COVID-19 declaration under Section 564(b)(1) of the Act, 21 U.S.C. section 360bbb-3(b)(1), unless the authorization is terminated or revoked.  Performed at [facility], [address] [place], [place] [address]   Urine Drug Screen, Qualitative     Status: Abnormal   Collection Time: 04/26/21  5:25 PM  Result Value Ref Range   Tricyclic, Ur Screen NONE DETECTED NONE DETECTED   Amphetamines, Ur Screen NONE DETECTED NONE DETECTED   MDMA (Ecstasy)Ur Screen NONE DETECTED NONE DETECTED   Cocaine Metabolite,Ur [place] NONE DETECTED NONE DETECTED   Opiate, Ur Screen NONE DETECTED NONE DETECTED   Phencyclidine (PCP) Ur S NONE DETECTED NONE DETECTED   Cannabinoid 50 Ng, Ur [place] POSITIVE (A) NONE DETECTED   Barbiturates, Ur  Screen NONE DETECTED NONE DETECTED   Benzodiazepine, Ur Scrn NONE DETECTED NONE DETECTED   Methadone Scn, Ur NONE DETECTED NONE DETECTED    Comment: (NOTE) Tricyclics +  metabolites, urine    Cutoff 1000 ng/mL Amphetamines + metabolites, urine  Cutoff 1000 ng/mL MDMA (Ecstasy), urine              Cutoff 500 ng/mL Cocaine Metabolite, urine          Cutoff 300 ng/mL Opiate + metabolites, urine        Cutoff 300 ng/mL Phencyclidine (PCP), urine         Cutoff 25 ng/mL Cannabinoid, urine                 Cutoff 50 ng/mL Barbiturates + metabolites, urine  Cutoff 200 ng/mL Benzodiazepine, urine              Cutoff 200 ng/mL Methadone, urine                   Cutoff 300 ng/mL  The urine drug screen provides only a preliminary, unconfirmed analytical test result and should not be used for non-medical purposes. Clinical consideration and professional judgment should be applied to any positive drug screen result due to possible interfering substances. A more specific alternate chemical method must be used in order to obtain a confirmed analytical result. Gas chromatography / mass spectrometry (GC/MS) is the preferred confirm atory method. Performed at [facility], [address] [place], [place] [address]   POC urine preg, ED     Status: None   Collection Time: 04/26/21  5:33 PM  Result Value Ref Range   Preg Test, Ur NEGATIVE NEGATIVE    Comment:        THE SENSITIVITY OF THIS METHODOLOGY IS >24 mIU/mL     Current Facility-Administered Medications  Medication Dose Route Frequency Provider Last Rate Last Admin  . ARIPiprazole (ABILIFY) tablet 10 mg  10 mg Oral Daily Jennfer Gassen T, MD      . nicotine (NICODERM CQ - dosed in mg/24 hours) patch 21 mg  21 mg Transdermal Daily Charelle Petrakis, Jackquline Denmark, MD       Current Outpatient Medications  Medication Sig Dispense Refill  . albuterol (VENTOLIN HFA) 108 (90 Base) MCG/ACT inhaler Inhale 2 puffs into the lungs every 6 (six) hours as  needed for wheezing or shortness of breath. 8 g 2  . ARIPiprazole (ABILIFY) 10 MG tablet Take 1 tablet (10 mg total) by mouth daily. 30 tablet 1  . cetirizine (ZYRTEC) 10 MG tablet Take 10 mg by mouth daily as needed for allergies.     . fluconazole (DIFLUCAN) 150 MG tablet Take one tablet at the onset of symptoms. If symptoms are still present 3 days later, take the second tablet. 2 tablet 0  . fluticasone (FLOVENT HFA) 44 MCG/ACT inhaler Inhale 2 puffs into the lungs 2 (two) times daily. 1 each 2  . hydrOXYzine (ATARAX/VISTARIL) 25 MG tablet Take 1 tablet (25 mg total) by mouth every 6 (six) hours as needed for anxiety. 30 tablet 1  . montelukast (SINGULAIR) 10 MG tablet Take 10 mg by mouth at bedtime.    . nitrofurantoin, macrocrystal-monohydrate, (MACROBID) 100 MG capsule Take 1 capsule (100 mg total) by mouth 2 (two) times daily. 10 capsule 0  . phenazopyridine (PYRIDIUM) 200 MG tablet Take 1 tablet (200 mg total) by mouth 3 (three) times daily. 6 tablet 0  . SPRINTEC 28 0.25-35 MG-MCG tablet Take 1 tablet by mouth daily.      Musculoskeletal: Strength & Muscle Tone: within normal limits Gait & Station: normal Patient leans: N/A  Psychiatric Specialty Exam:  Presentation  General Appearance: No data recorded Eye Contact:No data recorded Speech:No data recorded Speech Volume:No data recorded Handedness:No data recorded  Mood and Affect  Mood:No data recorded Affect:No data recorded  Thought Process  Thought Processes:No data recorded Descriptions of Associations:No data recorded Orientation:No data recorded Thought Content:No data recorded History of Schizophrenia/Schizoaffective disorder:No data recorded Duration of Psychotic Symptoms:No data recorded Hallucinations:No data recorded Ideas of Reference:No data recorded Suicidal Thoughts:No data recorded Homicidal Thoughts:No data recorded  Sensorium  Memory:No data recorded Judgment:No data  recorded Insight:No data recorded  Executive Functions  Concentration:No data recorded Attention Span:No data recorded Recall:No data recorded Fund of Knowledge:No data recorded Language:No data recorded  Psychomotor Activity  Psychomotor Activity:No data recorded  Assets  Assets:No data recorded  Sleep  Sleep:No data recorded  Physical Exam: Physical Exam Vitals and nursing note reviewed.  Constitutional:      Appearance: Normal appearance.  HENT:     Head: Normocephalic and atraumatic.     Mouth/Throat:     Pharynx: Oropharynx is clear.  Eyes:     Pupils: Pupils are equal, round, and reactive to light.  Cardiovascular:     Rate and Rhythm: Normal rate and regular rhythm.  Pulmonary:     Effort: Pulmonary effort is normal.     Breath sounds: Normal breath sounds.  Abdominal:     General: Abdomen is flat.     Palpations: Abdomen is soft.  Musculoskeletal:        General: Normal range of motion.  Skin:    General: Skin is warm and dry.  Neurological:     General: No focal deficit present.     Mental Status: She is alert. Mental status is at baseline.  Psychiatric:        Attention and Perception: She is inattentive.        Mood and Affect: Mood is anxious. Affect is labile.        Speech: She is noncommunicative. Speech is tangential.        Behavior: Behavior is agitated. Behavior is not aggressive.        Thought Content: Thought content is paranoid and delusional. Thought content does not include homicidal or suicidal ideation.        Cognition and Memory: Cognition is impaired.        Judgment: Judgment is impulsive.    Review of Systems  Constitutional: Negative.   HENT: Negative.   Eyes: Negative.   Respiratory: Negative.   Cardiovascular: Negative.   Gastrointestinal: Negative.   Musculoskeletal: Negative.   Skin: Negative.   Neurological: Negative.   Psychiatric/Behavioral: Positive for memory loss and substance abuse. Negative for depression,  hallucinations and suicidal ideas. The patient is nervous/anxious and has insomnia.    Blood pressure 125/82, pulse 97, temperature 98.4 F (36.9 C), temperature source Oral, resp. rate 20, height  (1.575 m), weight 65.8 kg, SpO2 100 %, unknown if currently breastfeeding. Body mass index is 26.52 kg/m.  Treatment Plan Summary: Medication management and Plan Patient currently disorganized and psychotic in her presentation.  Meets criteria for admission.  Reviewed with patient the plan including getting an EKG and a lipid panel, restarting Abilify, giving her a NicoDerm patch and working on admission with referral out to hospitals or admission here when a bed becomes available.  Case reviewed with emergency room doctor and nursing.  Labs such as they are so far reviewed and largely unremarkable except for the positive cannabis test.  Disposition: Recommend psychiatric Inpatient admission when medically cleared. Supportive therapy provided about ongoing stressors. Discussed crisis plan, support from social network, calling 911, coming to the Emergency Department, and calling Suicide Hotline.  Mordecai RasmussenJohn Alaa Mullally, MD 04/26/2021 6:20 PM

## 2021-04-27 ENCOUNTER — Inpatient Hospital Stay
Admission: RE | Admit: 2021-04-27 | Discharge: 2021-04-30 | DRG: 885 | Disposition: A | Discharge status: Home / Self Care | Payer: Self-pay | Source: Intra-hospital | Attending: Psychiatry | Admitting: Psychiatry

## 2021-04-27 ENCOUNTER — Encounter: Payer: Self-pay | Admitting: Psychiatry

## 2021-04-27 DIAGNOSIS — Z79899 Other long term (current) drug therapy: Secondary | ICD-10-CM

## 2021-04-27 DIAGNOSIS — G4700 Insomnia, unspecified: Secondary | ICD-10-CM | POA: Diagnosis present

## 2021-04-27 DIAGNOSIS — Z881 Allergy status to other antibiotic agents status: Secondary | ICD-10-CM

## 2021-04-27 DIAGNOSIS — Z7951 Long term (current) use of inhaled steroids: Secondary | ICD-10-CM

## 2021-04-27 DIAGNOSIS — F3110 Bipolar disorder, current episode manic without psychotic features, unspecified: Principal | ICD-10-CM | POA: Diagnosis present

## 2021-04-27 DIAGNOSIS — J309 Allergic rhinitis, unspecified: Secondary | ICD-10-CM | POA: Diagnosis present

## 2021-04-27 DIAGNOSIS — Z91040 Latex allergy status: Secondary | ICD-10-CM

## 2021-04-27 DIAGNOSIS — F419 Anxiety disorder, unspecified: Secondary | ICD-10-CM | POA: Diagnosis present

## 2021-04-27 DIAGNOSIS — F22 Delusional disorders: Secondary | ICD-10-CM | POA: Diagnosis present

## 2021-04-27 LAB — LIPID PANEL
Cholesterol: 185 mg/dL (ref 0–200)
HDL: 68 mg/dL (ref >40)
LDL Cholesterol: 106 mg/dL — ABNORMAL HIGH (ref 0–99)
Total CHOL/HDL Ratio: 2.7 RATIO
Triglycerides: 53 mg/dL (ref <150)
VLDL: 11 mg/dL (ref 0–40)

## 2021-04-27 LAB — HEMOGLOBIN A1C
Hgb A1c MFr Bld: 4.9 % (ref 4.8–5.6)
Mean Plasma Glucose: 93.93 mg/dL

## 2021-04-27 MED ORDER — ALBUTEROL SULFATE HFA 108 (90 BASE) MCG/ACT IN AERS
2.0000 | INHALATION_SPRAY | Freq: Four times a day (QID) | RESPIRATORY_TRACT | Status: DC | PRN
  Administered 2021-04-27 – 2021-04-29 (×4): 2 via RESPIRATORY_TRACT
  Filled 2021-04-27 (×2): qty 6.7

## 2021-04-27 MED ORDER — ALUM & MAG HYDROXIDE-SIMETH 200-200-20 MG/5ML PO SUSP: 30.0000 mL | ORAL | Status: DC | PRN

## 2021-04-27 MED ORDER — HYDROXYZINE HCL 50 MG PO TABS
50.0000 mg | ORAL_TABLET | Freq: Three times a day (TID) | ORAL | Status: DC | PRN
Start: 2021-04-27 — End: 2021-04-30
  Administered 2021-04-27 – 2021-04-29 (×3): 50 mg via ORAL
  Filled 2021-04-27 (×4): qty 1

## 2021-04-27 MED ORDER — ARIPIPRAZOLE 10 MG PO TABS
10.0000 mg | ORAL_TABLET | Freq: Every day | ORAL | Status: DC
  Administered 2021-04-28: 10 mg via ORAL
  Filled 2021-04-27: qty 1

## 2021-04-27 MED ORDER — MAGNESIUM HYDROXIDE 400 MG/5ML PO SUSP
30.0000 mL | Freq: Every day | ORAL | Status: DC | PRN
Start: 2021-04-27 — End: 2021-04-30

## 2021-04-27 MED ORDER — ACETAMINOPHEN 325 MG PO TABS
650.0000 mg | ORAL_TABLET | Freq: Four times a day (QID) | ORAL | Status: DC | PRN
  Administered 2021-04-27: 650 mg via ORAL
  Filled 2021-04-27: qty 2

## 2021-04-27 NOTE — Plan of Care (Signed)
Patient new to the unit today, hasn't had time to progress  Problem: Education: Goal: Emotional status will improve Outcome: Not Progressing Goal: Mental status will improve Outcome: Not Progressing   

## 2021-04-27 NOTE — Tx Team (Signed)
Initial Treatment Plan 04/27/2021 4:00 PM Chelsea Patel [id number]    PATIENT STRESSORS: Financial difficulties Marital or family conflict   PATIENT STRENGTHS: Active sense of humor Capable of independent living Physical Health Special hobby/interest Supportive family/friends   PATIENT IDENTIFIED PROBLEMS: Anxiety  Medication noncompliance                   DISCHARGE CRITERIA:  Improved stabilization in mood, thinking, and/or behavior Verbal commitment to aftercare and medication compliance  PRELIMINARY DISCHARGE PLAN: Return to previous living arrangement  PATIENT/FAMILY INVOLVEMENT: This treatment plan has been presented to and reviewed with the patient, [employer]. The patient has been given the opportunity to ask questions and make suggestions.  [name], RN 04/27/2021, 4:00 PM

## 2021-04-27 NOTE — Progress Notes (Signed)
Chelsea Patel appeared on the unit disheveled and anxious. She stated she was "anxious about coming to the unit." She also complained of a headache rating 6/10. Her main stressors include financial problems, "getting her medications right from a different pharmacy", and anxiety. 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 She lives with boyfriend and he is supportive. Will discharge back to private residence. She denies SI, HI, and AVH. She smokes 1 pack of cigarettes a day, 5 grams of marijuana a day, and drinks "12 pack" every other weekend. Skin assessment done and no contraband was found. She was given a tour of the unit and agreed to the safety regulations. She is pleasant and is seen interacting safely with her peers. Will observe with 15 minute safety checks.

## 2021-04-27 NOTE — B[facility] Group Notes (Signed)
LCSW Group Therapy Note     04/27/2021 2:53 PM     Type of Therapy/Topic:  Group Therapy:  Feelings about Diagnosis     Participation Level:  Did Not Attend     Description of Group:   This group will allow patients to explore their thoughts and feelings about diagnoses they have received. Patients will be guided to explore their level of understanding and acceptance of these diagnoses. Facilitator will encourage patients to process their thoughts and feelings about the reactions of others to their diagnosis and will guide patients in identifying ways to discuss their diagnosis with significant others in their lives. This group will be process-oriented, with patients participating in exploration of their own experiences, giving and receiving support, and processing challenge from other group members.        Therapeutic Goals:  1.    Patient will demonstrate understanding of diagnosis as evidenced by identifying two or more symptoms of the disorder  2.    Patient will be able to express two feelings regarding the diagnosis  3.    Patient will demonstrate their ability to communicate their needs through discussion and/or role play     Summary of Patient Progress: X  Therapeutic Modalities:   Cognitive Behavioral Therapy  Brief Therapy  Feelings Identification    Katelyn Broadnax [place], MSW, LCSW-A  04/27/2021 2:53 PM

## 2021-04-27 NOTE — Progress Notes (Signed)
Patient presents with an irritable affect. Patient denies SI/HI/AVH, endorses anxiety. Patient didn't have any scheduled medications tonight and hasn't asked for anything PRN. Patient observed interacting appropriately with peers but can be demanding and irritable with staff. Patient given education, support, and encouragement to be active in hi her treatment plan. Patient being monitored Q 15 minutes for safety per unit protocol. Pt remains safe on the unit.

## 2021-04-27 NOTE — ED Notes (Signed)
Pt in shower at this time. Pt given new scrubs, towels and soap. Pt's breakfast tray sitting in pt room. Will continue to monitor.

## 2021-04-27 NOTE — ED Notes (Signed)
Per TTS. Patient being admitted to inpatient BMU Room # 322 after 2:30pm today.

## 2021-04-27 NOTE — ED Notes (Signed)
Pt resting at this time. Lunch tray placed in room. Will try to wake pt later to eat. Will continue to monitor.

## 2021-04-27 NOTE — Consult Note (Signed)
[facility]   Reason for Consult: Follow-up consult 20 year old woman with bipolar disorder who has been in the emergency room [facility] Patient Identification: Chelsea Crosbymber Swiderski MRN:  [id number] Principal Diagnosis: Bipolar affective disorder, manic, severe, with psychotic behavior (HCC) Diagnosis:  Principal Problem:   Bipolar affective disorder, manic, severe, with psychotic behavior (HCC) Active Problems:   Cannabis abuse   Total Time spent with patient: 30 minutes  Subjective:   Chelsea Patel is a 20 y.o. female patient admitted with "I am okay".  HPI: Follow-up for this 20 year old woman with bipolar disorder.  Seen yesterday she was manic psychotic agitated disorganized and paranoid.  Patient was restarted on Abilify.  Has been compliant with medicine.  Mostly staying withdrawn.  Still disorganized in her speech and thinking.  No new physical complaints.  Past Psychiatric History: Past history of bipolar disorder with manic episodes and psychotic symptoms  Risk to Self:   Risk to Others:   Prior Inpatient Therapy:   Prior Outpatient Therapy:    Past Medical History:  Past Medical History:  Diagnosis Date  . Asthma    hx of  . Halitosis   . Rhinitis, allergic   . Tonsillith   . Tonsillitis    chronic    Past Surgical History:  Procedure Laterality Date  . TONSILLECTOMY AND ADENOIDECTOMY N/A 03/22/2017   Procedure: TONSILLECTOMY AND POSSIBLY  ADENOIDECTOMY;  Surgeon: Bud Facereighton Vaught, MD;  [facility];  Service: ENT;  Laterality: N/A;  upreg   Family History: History reviewed. No pertinent family history. Family Psychiatric  History: See previous Social History:  Social History   Substance and Sexual Activity  Alcohol Use No     Social History   Substance and Sexual Activity  Drug Use No    Social History   Socioeconomic History  . Marital status: Single    Spouse name: Not on file  .  Number of children: Not on file  . Years of education: Not on file  . Highest education level: Not on file  Occupational History  . Not on file  Tobacco Use  . Smoking status: Never Smoker  . Smokeless tobacco: Never Used  Vaping Use  . Vaping Use: Every day  Substance and Sexual Activity  . Alcohol use: No  . Drug use: No  . Sexual activity: Not on file  Other Topics Concern  . Not on file  Social History Narrative  . Not on file   Social Determinants of Health   Financial Resource Strain: Not on file  Food Insecurity: Not on file  Transportation Needs: Not on file  Physical Activity: Not on file  Stress: Not on file  Social Connections: Not on file   Additional Social History:    Allergies:   Allergies  Allergen Reactions  . Omnicef [Cefdinir] Other (See Comments)    GI upset  . Latex Rash    Labs:  Results for orders placed or performed during the hospital encounter of 04/26/21 (from the past 48 hour(s))  Comprehensive metabolic panel     Status: Abnormal   Collection Time: 04/26/21  5:11 PM  Result Value Ref Range   Sodium 138 135 - 145 mmol/L   Potassium 3.9 3.5 - 5.1 mmol/L   Chloride 105 98 - 111 mmol/L   CO2 21 (L) 22 - 32 mmol/L   Glucose, Bld 87 70 - 99 mg/dL    Comment: Glucose reference range applies only to samples taken after fasting  for at least 8 hours.   BUN 11 6 - 20 mg/dL   Creatinine, Ser 7.78 0.44 - 1.00 mg/dL   Calcium 9.8 8.9 - 24.2 mg/dL   Total Protein 8.2 (H) 6.5 - 8.1 g/dL   Albumin 4.8 3.5 - 5.0 g/dL   AST 66 (H) 15 - 41 U/L   ALT 35 0 - 44 U/L   Alkaline Phosphatase 69 38 - 126 U/L   Total Bilirubin 1.6 (H) 0.3 - 1.2 mg/dL   GFR, Estimated >35 >36 mL/min    Comment: (NOTE) Calculated using the CKD-EPI Creatinine Equation (2021)    Anion gap 12 5 - 15    Comment: Performed at [facility], [address] [place], [place] [address]  Ethanol     Status: None   Collection Time: 04/26/21  5:11 PM  Result Value Ref  Range   Alcohol, Ethyl (B) <10 <10 mg/dL    Comment: (NOTE) Lowest detectable limit for serum alcohol is 10 mg/dL.  For medical purposes only. Performed at [facility], [address] [place], [place] [address]   Salicylate level     Status: Abnormal   Collection Time: 04/26/21  5:11 PM  Result Value Ref Range   Salicylate Lvl <7.0 (L) 7.0 - 30.0 mg/dL    Comment: Performed at [facility], [address] [place], [place] [address]  Acetaminophen level     Status: Abnormal   Collection Time: 04/26/21  5:11 PM  Result Value Ref Range   Acetaminophen (Tylenol), Serum <10 (L) 10 - 30 ug/mL    Comment: (NOTE) Therapeutic concentrations vary significantly. A range of 10-30 ug/mL  may be an effective concentration for many patients. However, some  are best treated at concentrations outside of this range. Acetaminophen concentrations >150 ug/mL at 4 hours after ingestion  and >50 ug/mL at 12 hours after ingestion are often associated with  toxic reactions.  Performed at [facility], [address] [place], [place] [address]   cbc     Status: Abnormal   Collection Time: 04/26/21  5:11 PM  Result Value Ref Range   WBC 12.6 (H) 4.0 - 10.5 K/uL   RBC 4.61 3.87 - 5.11 MIL/uL   Hemoglobin 13.8 12.0 - 15.0 g/dL   HCT 67.1 24.5 - 80.9 %   MCV 87.0 80.0 - 100.0 fL   MCH 29.9 26.0 - 34.0 pg   MCHC 34.4 30.0 - 36.0 g/dL   RDW 98.3 38.2 - 50.5 %   Platelets 399 150 - 400 K/uL   nRBC 0.0 0.0 - 0.2 %    Comment: Performed at [facility], [address] [place], [place] [address]  TSH     Status: None   Collection Time: 04/26/21  5:11 PM  Result Value Ref Range   TSH 1.649 0.350 - 4.500 uIU/mL    Comment: Performed by a 3rd Generation assay with a functional sensitivity of <=0.01 uIU/mL. Performed at [facility], [address] [place], [place] [address]   Lipid panel     Status: Abnormal   Collection Time: 04/26/21  5:11 PM   Result Value Ref Range   Cholesterol 185 0 - 200 mg/dL   Triglycerides 53 <790 mg/dL   HDL 68 >24 mg/dL   Total CHOL/HDL Ratio 2.7 RATIO   VLDL 11 0 - 40 mg/dL   LDL Cholesterol 097 (H) 0 - 99 mg/dL    Comment:  Total Cholesterol/HDL:CHD Risk Coronary Heart Disease Risk Table                     Men   Women  1/2 Average Risk   3.4   3.3  Average Risk       5.0   4.4  2 X Average Risk   9.6   7.1  3 X Average Risk  23.4   11.0        Use the calculated Patient Ratio above and the CHD Risk Table to determine the patient's CHD Risk.        ATP III CLASSIFICATION (LDL):  <100     mg/dL   Optimal  841-324  mg/dL   Near or Above                    Optimal  130-159  mg/dL   Borderline  401-027  mg/dL   High  >253     mg/dL   Very High Performed at [facility], [address] [place], [place] [address]   Hemoglobin A1c     Status: None   Collection Time: 04/26/21  5:11 PM  Result Value Ref Range   Hgb A1c MFr Bld 4.9 4.8 - 5.6 %    Comment: (NOTE) Pre diabetes:          5.7%-6.4%  Diabetes:              >6.4%  Glycemic control for   <7.0% adults with diabetes    Mean Plasma Glucose 93.93 mg/dL    Comment: Performed at [facility], [address] [place], [place] [address]  Resp Panel by RT-PCR (Flu A&B, Covid) Nasopharyngeal Swab     Status: None   Collection Time: 04/26/21  5:23 PM   Specimen: Nasopharyngeal Swab; Nasopharyngeal(NP) swabs in vial transport medium  Result Value Ref Range   SARS Coronavirus 2 by RT PCR NEGATIVE NEGATIVE    Comment: (NOTE) SARS-CoV-2 target nucleic acids are NOT DETECTED.  The SARS-CoV-2 RNA is generally detectable in upper respiratory specimens during the acute phase of infection. The lowest concentration of SARS-CoV-2 viral copies this assay can detect is 138 copies/mL. A negative result does not preclude SARS-Cov-2 infection and should not be used as the sole basis for treatment or other patient management  decisions. A negative result may occur with  improper specimen collection/handling, submission of specimen other than nasopharyngeal swab, presence of viral mutation(s) within the areas targeted by this assay, and inadequate number of viral copies(<138 copies/mL). A negative result must be combined with clinical observations, patient history, and epidemiological information. The expected result is Negative.  Fact Sheet for Patients:  BloggerCourse.com  Fact Sheet for Healthcare Providers:  SeriousBroker.it  This test is no t yet approved or cleared by the [place] FDA and  has been authorized for detection and/or diagnosis of SARS-CoV-2 by FDA under an Emergency Use Authorization (EUA). This EUA will remain  in effect (meaning this test can be used) for the duration of the COVID-19 declaration under Section 564(b)(1) of the Act, 21 U.S.C.section 360bbb-3(b)(1), unless the authorization is terminated  or revoked sooner.       Influenza A by PCR NEGATIVE NEGATIVE   Influenza B by PCR NEGATIVE NEGATIVE    Comment: (NOTE) The Xpert Xpress SARS-CoV-2/FLU/RSV plus assay is intended as an aid in the diagnosis of influenza from Nasopharyngeal swab specimens and should not be used as a  sole basis for treatment. Nasal washings and aspirates are unacceptable for Xpert Xpress SARS-CoV-2/FLU/RSV testing.  Fact Sheet for Patients: BloggerCourse.com  Fact Sheet for Healthcare Providers: SeriousBroker.it  This test is not yet approved or cleared by the [place] FDA and has been authorized for detection and/or diagnosis of SARS-CoV-2 by FDA under an Emergency Use Authorization (EUA). This EUA will remain in effect (meaning this test can be used) for the duration of the COVID-19 declaration under Section 564(b)(1) of the Act, 21 U.S.C. section 360bbb-3(b)(1), unless the authorization  is terminated or revoked.  Performed at [facility], [address] [place], [place] [address]   Urine Drug Screen, Qualitative     Status: Abnormal   Collection Time: 04/26/21  5:25 PM  Result Value Ref Range   Tricyclic, Ur Screen NONE DETECTED NONE DETECTED   Amphetamines, Ur Screen NONE DETECTED NONE DETECTED   MDMA (Ecstasy)Ur Screen NONE DETECTED NONE DETECTED   Cocaine Metabolite,Ur [place] NONE DETECTED NONE DETECTED   Opiate, Ur Screen NONE DETECTED NONE DETECTED   Phencyclidine (PCP) Ur S NONE DETECTED NONE DETECTED   Cannabinoid 50 Ng, Ur [place] POSITIVE (A) NONE DETECTED   Barbiturates, Ur Screen NONE DETECTED NONE DETECTED   Benzodiazepine, Ur Scrn NONE DETECTED NONE DETECTED   Methadone Scn, Ur NONE DETECTED NONE DETECTED    Comment: (NOTE) Tricyclics + metabolites, urine    Cutoff 1000 ng/mL Amphetamines + metabolites, urine  Cutoff 1000 ng/mL MDMA (Ecstasy), urine              Cutoff 500 ng/mL Cocaine Metabolite, urine          Cutoff 300 ng/mL Opiate + metabolites, urine        Cutoff 300 ng/mL Phencyclidine (PCP), urine         Cutoff 25 ng/mL Cannabinoid, urine                 Cutoff 50 ng/mL Barbiturates + metabolites, urine  Cutoff 200 ng/mL Benzodiazepine, urine              Cutoff 200 ng/mL Methadone, urine                   Cutoff 300 ng/mL  The urine drug screen provides only a preliminary, unconfirmed analytical test result and should not be used for non-medical purposes. Clinical consideration and professional judgment should be applied to any positive drug screen result due to possible interfering substances. A more specific alternate chemical method must be used in order to obtain a confirmed analytical result. Gas chromatography / mass spectrometry (GC/MS) is the preferred confirm atory method. Performed at [facility], [address] [place], [place] [address]   POC urine preg, ED     Status: None   Collection Time: 04/26/21   5:33 PM  Result Value Ref Range   Preg Test, Ur NEGATIVE NEGATIVE    Comment:        THE SENSITIVITY OF THIS METHODOLOGY IS >24 mIU/mL     Current Facility-Administered Medications  Medication Dose Route Frequency Provider Last Rate Last Admin  . ARIPiprazole (ABILIFY) tablet 10 mg  10 mg Oral Daily Itai Barbian T, MD   10 mg at 04/27/21 7867  . nicotine (NICODERM CQ - dosed in mg/24 hours) patch 21 mg  21 mg Transdermal Daily Deniyah Dillavou, Jackquline Denmark, MD   21 mg at 04/27/21 6720   Current Outpatient Medications  Medication Sig Dispense Refill  . albuterol (VENTOLIN HFA)  108 (90 Base) MCG/ACT inhaler Inhale 2 puffs into the lungs every 6 (six) hours as needed for wheezing or shortness of breath. 8 g 2  . ARIPiprazole (ABILIFY) 10 MG tablet Take 1 tablet (10 mg total) by mouth daily. 30 tablet 1  . cetirizine (ZYRTEC) 10 MG tablet Take 10 mg by mouth daily as needed for allergies.     . fluconazole (DIFLUCAN) 150 MG tablet Take one tablet at the onset of symptoms. If symptoms are still present 3 days later, take the second tablet. 2 tablet 0  . fluticasone (FLOVENT HFA) 44 MCG/ACT inhaler Inhale 2 puffs into the lungs 2 (two) times daily. 1 each 2  . hydrOXYzine (ATARAX/VISTARIL) 25 MG tablet Take 1 tablet (25 mg total) by mouth every 6 (six) hours as needed for anxiety. 30 tablet 1  . montelukast (SINGULAIR) 10 MG tablet Take 10 mg by mouth at bedtime.    . nitrofurantoin, macrocrystal-monohydrate, (MACROBID) 100 MG capsule Take 1 capsule (100 mg total) by mouth 2 (two) times daily. 10 capsule 0  . phenazopyridine (PYRIDIUM) 200 MG tablet Take 1 tablet (200 mg total) by mouth 3 (three) times daily. 6 tablet 0  . SPRINTEC 28 0.25-35 MG-MCG tablet Take 1 tablet by mouth daily.      Musculoskeletal: Strength & Muscle Tone: within normal limits Gait & Station: normal Patient leans: N/A            Psychiatric Specialty Exam:  Presentation  General Appearance: No data recorded Eye  Contact:No data recorded Speech:No data recorded Speech Volume:No data recorded Handedness:No data recorded  Mood and Affect  Mood:No data recorded Affect:No data recorded  Thought Process  Thought Processes:No data recorded Descriptions of Associations:No data recorded Orientation:No data recorded Thought Content:No data recorded History of Schizophrenia/Schizoaffective disorder:No  Duration of Psychotic Symptoms:Less than six months  Hallucinations:No data recorded Ideas of Reference:No data recorded Suicidal Thoughts:No data recorded Homicidal Thoughts:No data recorded  Sensorium  Memory:No data recorded Judgment:No data recorded Insight:No data recorded  Executive Functions  Concentration:No data recorded Attention Span:No data recorded Recall:No data recorded Fund of Knowledge:No data recorded Language:No data recorded  Psychomotor Activity  Psychomotor Activity:No data recorded  Assets  Assets:No data recorded  Sleep  Sleep:No data recorded  Physical Exam: Physical Exam Vitals and nursing note reviewed.  Constitutional:      Appearance: Normal appearance.  HENT:     Head: Normocephalic and atraumatic.     Mouth/Throat:     Pharynx: Oropharynx is clear.  Eyes:     Pupils: Pupils are equal, round, and reactive to light.  Cardiovascular:     Rate and Rhythm: Normal rate and regular rhythm.  Pulmonary:     Effort: Pulmonary effort is normal.     Breath sounds: Normal breath sounds.  Abdominal:     General: Abdomen is flat.     Palpations: Abdomen is soft.  Musculoskeletal:        General: Normal range of motion.  Skin:    General: Skin is warm and dry.  Neurological:     General: No focal deficit present.     Mental Status: She is alert. Mental status is at baseline.  Psychiatric:        Attention and Perception: She is inattentive.        Mood and Affect: Mood normal. Affect is labile.        Speech: Speech is tangential.        Behavior:  Behavior is slowed.  Thought Content: Thought content is paranoid and delusional.        Cognition and Memory: Cognition is impaired.        Judgment: Judgment is impulsive.    Review of Systems  Constitutional: Negative.   HENT: Negative.   Eyes: Negative.   Respiratory: Negative.   Cardiovascular: Negative.   Gastrointestinal: Negative.   Musculoskeletal: Negative.   Skin: Negative.   Neurological: Negative.   Psychiatric/Behavioral: The patient is nervous/anxious and has insomnia.    Blood pressure 112/71, pulse 90, temperature 98.2 F (36.8 C), temperature source Oral, resp. rate 16, height 5\' 2"  (1.575 m), weight 65.8 kg, SpO2 97 %, unknown if currently breastfeeding. Body mass index is 26.52 kg/m.  Treatment Plan Summary: Medication management and Plan Patient continues to have psychotic symptoms disorganized thinking odd behavior.  Continues to meet criteria for inpatient hospitalization.  Plan will be for admission to our ward as I think we have a bed available today.  Orders reviewed and admission orders placed.  Case reviewed with TTS and ER physician  Disposition: Recommend psychiatric Inpatient admission when medically cleared.  , MD 04/27/2021 12:47 PM
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
## 2021-04-27 NOTE — BH Assessment (Signed)
Patient is to be admitted to [facility] on today at 2:30pm by Dr. Toni Amend.  Attending Physician will be Dr. Toni Amend.   Patient has been assigned to room 322, by [facility] Charge Nurse, Maryelizabeth Kaufmann.     ER staff is aware of the admission:  Inetta Fermo, ER Secretary    Dr. Sunday Spillers, ER MD   Pattricia Boss, Patient's Nurse   Ethelene Browns, Patient Access.

## 2021-04-28 DIAGNOSIS — F312 Bipolar disorder, current episode manic severe with psychotic features: Secondary | ICD-10-CM

## 2021-04-28 MED ORDER — TEMAZEPAM 15 MG PO CAPS
15.0000 mg | ORAL_CAPSULE | Freq: Every evening | ORAL | Status: DC | PRN
  Administered 2021-04-28 – 2021-04-29 (×2): 15 mg via ORAL
  Filled 2021-04-28 (×2): qty 1

## 2021-04-28 MED ORDER — LORAZEPAM 2 MG/ML IJ SOLN: 2.0000 mg | Freq: Four times a day (QID) | INTRAMUSCULAR | Status: DC | PRN

## 2021-04-28 MED ORDER — LORAZEPAM 2 MG PO TABS
2.0000 mg | ORAL_TABLET | Freq: Four times a day (QID) | ORAL | Status: DC | PRN
  Administered 2021-04-28 – 2021-04-29 (×2): 2 mg via ORAL
  Filled 2021-04-28 (×2): qty 1

## 2021-04-28 MED ORDER — ARIPIPRAZOLE 10 MG PO TABS
20.0000 mg | ORAL_TABLET | Freq: Every day | ORAL | Status: DC
  Administered 2021-04-29 – 2021-04-30 (×2): 20 mg via ORAL
  Filled 2021-04-28 (×2): qty 2
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
## 2021-04-28 NOTE — Progress Notes (Signed)
Patient was reassessed. She denies having thoughts of hurting herself and states, "why do you all keep asking that, I am sick of it. I have never had thoughts of hurting myself."

## 2021-04-28 NOTE — Plan of Care (Signed)
  Problem: Education: Goal: Knowledge of [facility] Education information/materials will improve Outcome: Not Progressing Goal: Emotional status will improve Outcome: Not Progressing Goal: Mental status will improve Outcome: Progressing Goal: Verbalization of understanding the information provided will improve Outcome: Progressing   Problem: Activity: Goal: Sleeping patterns will improve Outcome: Progressing   Problem: Coping: Goal: Ability to verbalize frustrations and anger appropriately will improve Outcome: Progressing   Problem: Safety: Goal: Periods of time without injury will increase Outcome: Progressing

## 2021-04-28 NOTE — Progress Notes (Signed)
Recreation Therapy Notes   Date: 04/28/2021  Time: 10:00 am   Location: Courtyard   Behavioral response: Appropriate  Intervention Topic: Leisure   Discussion/Intervention:  Group content today was focused on leisure. The group defined what leisure is and some positive leisure activities they participate in. Individuals identified the difference between good and bad leisure. Participants expressed how they feel after participating in the leisure of their choice. The group discussed how they go about picking a leisure activity and if others are involved in their leisure activities. The patient stated how many leisure activities they have to choose from and reasons why it is important to have leisure time. Individuals participated in the intervention "Exploration of Leisure" where they had a chance to identify new leisure activities as well as benefits of leisure. Clinical Observations/Feedback: Patient came to group and was focused on what peers and staff had to say. Individual was social with staff and peers while participating in the intervention. Clinton Wahlberg LRT/CTRS         Diamon Reddinger 04/28/2021 11:48 AM

## 2021-04-28 NOTE — BHH Group Notes (Signed)
LCSW Group Therapy Note  04/28/2021 2:27 PM  Type of Therapy/Topic:  Group Therapy:  Emotion Regulation  Participation Level:  Active   Description of Group:   The purpose of this group is to assist patients in learning to regulate negative emotions and experience positive emotions. Patients will be guided to discuss ways in which they have been vulnerable to their negative emotions. These vulnerabilities will be juxtaposed with experiences of positive emotions or situations, and patients will be challenged to use positive emotions to combat negative ones. Special emphasis will be placed on coping with negative emotions in conflict situations, and patients will process healthy conflict resolution skills.  Therapeutic Goals: 1. Patient will identify two positive emotions or experiences to reflect on in order to balance out negative emotions 2. Patient will label two or more emotions that they find the most difficult to experience 3. Patient will demonstrate positive conflict resolution skills through discussion and/or role plays  Summary of Patient Progress: Patient was present for the entirety of group except when she stepped out to get her inhaler. She was able to identify strong emotions that she faces in her life and triggers from them like political discussions with her family. Pt identified some positive things to deal with her strong emotions, however, she does appear to feel that substance use is a positive coping mechanism.  Therapeutic Modalities:   Cognitive Behavioral Therapy Feelings Identification Dialectical Behavioral Therapy  Vilma Meckel. Algis Greenhouse, MSW, LCSW, LCAS 04/28/2021 2:27 PM
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
## 2021-04-28 NOTE — [facility] Suicide Risk Assessment (Signed)
[facility] INPATIENT:  Family/Significant Other Suicide Prevention Education  Suicide Prevention Education:  Education Completed; Mat Swaney,boyfriend  (name of family member/significant other) has been identified by the patient as the family member/significant other with whom the patient will be residing, and identified as the person(s) who will aid the patient in the event of a mental health crisis (suicidal ideations/suicide attempt).  With written consent from the patient, the family member/significant other has been provided the following suicide prevention education, prior to the and/or following the discharge of the patient.  The suicide prevention education provided includes the following: Suicide risk factors Suicide prevention and interventions National Suicide Hotline telephone number [facility] assessment telephone number [facility] Emergency Assistance 911 [facility] and/or Residential Mobile Crisis Unit telephone number  Request made of family/significant other to: Remove weapons (e.g., guns, rifles, knives), all items previously/currently identified as safety concern.   Remove drugs/medications (over-the-counter, prescriptions, illicit drugs), all items previously/currently identified as a safety concern.  The family member/significant other verbalizes understanding of the suicide prevention education information provided.  The family member/significant other agrees to remove the items of safety concern listed above.  Aseem Sessums A [place] 04/28/2021, 4:21 PM
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
## 2021-04-28 NOTE — Progress Notes (Signed)
Patient with several requests to have belongings returned to her on unit. Became tearful when requests not honored. Pt irritable at times and intrusive. Prn given for anxiety and agitation with good relief. Pt Denies any SI, HI, AVH.  Encouragement and support provided. Safety checks maintained. Medications given as prescribed. Pt receptive and remains safe on unit with q 15 min checks.

## 2021-04-28 NOTE — Progress Notes (Signed)
Patient used call bell and this Clinical research associate and the other RN went to see what was wrong. Patient crying saying she hasn't gotten any of her lab results and wants to talk to the doctor. Patient given education and support. Patient crying stating she can't sleep alone. Patient given education. Patient continues to be safe on the unit.

## 2021-04-28 NOTE — Progress Notes (Signed)
Patient is calm and cooperative with assessment. Affect is flat and she appears depressed and sad. Patient came to take her medications and did not voice any complaints. She denied SI, HI, and AVH. She also denied pain and medication side effects. Patient remains medication compliant. Support and encouragement provided. Patient remains safe on the unit at this time and q15 min safety checks are maintained.

## 2021-04-28 NOTE — Tx Team (Addendum)
Interdisciplinary Treatment and Diagnostic Plan Update  04/28/2021 Time of Session: 0900 Chelsea Patel MRN: [id number]  Principal Diagnosis: <principal problem not specified>  Secondary Diagnoses: Active Problems:   Bipolar affective disorder, current episode manic (HCC)   Current Medications:  Current Facility-Administered Medications  Medication Dose Route Frequency Provider Last Rate Last Admin  . acetaminophen (TYLENOL) tablet 650 mg  650 mg Oral Q6H PRN Clapacs, John T, MD   650 mg at 04/27/21 1612  . albuterol (VENTOLIN HFA) 108 (90 Base) MCG/ACT inhaler 2 puff  2 puff Inhalation Q6H PRN Clapacs, Jackquline Denmark, MD   2 puff at 04/28/21 0851  . alum & mag hydroxide-simeth (MAALOX/MYLANTA) 200-200-20 MG/5ML suspension 30 mL  30 mL Oral Q4H PRN Clapacs, John T, MD      . ARIPiprazole (ABILIFY) tablet 10 mg  10 mg Oral Daily Clapacs, Jackquline Denmark, MD   10 mg at 04/28/21 0850  . hydrOXYzine (ATARAX/VISTARIL) tablet 50 mg  50 mg Oral TID PRN Clapacs, Jackquline Denmark, MD   50 mg at 04/27/21 1613  . magnesium hydroxide (MILK OF MAGNESIA) suspension 30 mL  30 mL Oral Daily PRN Clapacs, Jackquline Denmark, MD       PTA Medications: Medications Prior to Admission  Medication Sig Dispense Refill Last Dose  . albuterol (VENTOLIN HFA) 108 (90 Base) MCG/ACT inhaler Inhale 2 puffs into the lungs every 6 (six) hours as needed for wheezing or shortness of breath. 8 g 2   . ARIPiprazole (ABILIFY) 10 MG tablet Take 1 tablet (10 mg total) by mouth daily. 30 tablet 1   . cetirizine (ZYRTEC) 10 MG tablet Take 10 mg by mouth daily as needed for allergies.      . fluconazole (DIFLUCAN) 150 MG tablet Take one tablet at the onset of symptoms. If symptoms are still present 3 days later, take the second tablet. 2 tablet 0   . fluticasone (FLOVENT HFA) 44 MCG/ACT inhaler Inhale 2 puffs into the lungs 2 (two) times daily. 1 each 2   . hydrOXYzine (ATARAX/VISTARIL) 25 MG tablet Take 1 tablet (25 mg total) by mouth every 6 (six) hours as needed  for anxiety. 30 tablet 1   . montelukast (SINGULAIR) 10 MG tablet Take 10 mg by mouth at bedtime.     . nitrofurantoin, macrocrystal-monohydrate, (MACROBID) 100 MG capsule Take 1 capsule (100 mg total) by mouth 2 (two) times daily. 10 capsule 0   . phenazopyridine (PYRIDIUM) 200 MG tablet Take 1 tablet (200 mg total) by mouth 3 (three) times daily. 6 tablet 0   . SPRINTEC 28 0.25-35 MG-MCG tablet Take 1 tablet by mouth daily.       Patient Stressors: Financial difficulties Marital or family conflict  Patient Strengths: Active sense of humor Capable of independent living Physical Health Special hobby/interest Supportive family/friends  Treatment Modalities: Medication Management, Group therapy, Case management,  1 to 1 session with clinician, Psychoeducation, Recreational therapy.   Physician Treatment Plan for Primary Diagnosis: <principal problem not specified> Long Term Goal(s):     Short Term Goals:    Medication Management: Evaluate patient's response, side effects, and tolerance of medication regimen.  Therapeutic Interventions: 1 to 1 sessions, Unit Group sessions and Medication administration.  Evaluation of Outcomes: Progressing  Physician Treatment Plan for Secondary Diagnosis: Active Problems:   Bipolar affective disorder, current episode manic (HCC)  Long Term Goal(s):     Short Term Goals:       Medication Management: Evaluate patient's response, side effects, and tolerance  of medication regimen.  Therapeutic Interventions: 1 to 1 sessions, Unit Group sessions and Medication administration.  Evaluation of Outcomes: Progressing   RN Treatment Plan for Primary Diagnosis: <principal problem not specified> Long Term Goal(s): Knowledge of disease and therapeutic regimen to maintain health will improve  Short Term Goals: Ability to remain free from injury will improve, Ability to verbalize frustration and anger appropriately will improve, Ability to demonstrate  self-control, Ability to participate in decision making will improve, Ability to verbalize feelings will improve, Ability to disclose and discuss suicidal ideas, Ability to identify and develop effective coping behaviors will improve and Compliance with prescribed medications will improve  Medication Management: RN will administer medications as ordered by provider, will assess and evaluate patient's response and provide education to patient for prescribed medication. RN will report any adverse and/or side effects to prescribing provider.  Therapeutic Interventions: 1 on 1 counseling sessions, Psychoeducation, Medication administration, Evaluate responses to treatment, Monitor vital signs and CBGs as ordered, Perform/monitor CIWA, COWS, AIMS and Fall Risk screenings as ordered, Perform wound care treatments as ordered.  Evaluation of Outcomes: Progressing   LCSW Treatment Plan for Primary Diagnosis: <principal problem not specified> Long Term Goal(s): Safe transition to appropriate next level of care at discharge, Engage patient in therapeutic group addressing interpersonal concerns.  Short Term Goals: Engage patient in aftercare planning with referrals and resources, Increase social support, Increase ability to appropriately verbalize feelings, Increase emotional regulation, Facilitate acceptance of mental health diagnosis and concerns, Facilitate patient progression through stages of change regarding substance use diagnoses and concerns, Identify triggers associated with mental health/substance abuse issues and Increase skills for wellness and recovery  Therapeutic Interventions: Assess for all discharge needs, 1 to 1 time with Social worker, Explore available resources and support systems, Assess for adequacy in community support network, Educate family and significant other(s) on suicide prevention, Complete Psychosocial Assessment, Interpersonal group therapy.  Evaluation of Outcomes:  Progressing   Progress in Treatment: Attending groups: No. Participating in groups: No. Taking medication as prescribed: Yes. Toleration medication: Yes. Family/Significant other contact made: No, will contact:  CSW will attempt to obtain consent  Patient understands diagnosis: No. Discussing patient identified problems/goals with staff: Yes. Medical problems stabilized or resolved: Yes. Denies suicidal/homicidal ideation: Yes. Issues/concerns per patient self-inventory: Yes. Other: None  New problem(s) identified: Yes, Describe:  Patient discussed difficulty with controlling mood and affect in addition to cannabis use.  New Short Term/Long Term Goal(s): elimination of symptoms of psychosis, medication management for mood stabilization; elimination of SI thoughts; development of comprehensive mental wellness/sobriety plan.  Patient Goals: "to loose the attitude . . . and stop smoking pot"   Discharge Plan or Barriers: None identified at this time.   Reason for Continuation of Hospitalization: Delusions  Medication stabilization  Estimated Length of Stay: 1-7 days   Recreational Therapy: Patient Stressors: N/A Patient Goal: Patient will engage in groups without prompting or encouragement from LRT x3 group sessions within 5 recreation therapy group sessions.  Attendees: Patient: Chelsea Patel 04/28/2021 10:21 AM  Physician: Mordecai Rasmussen, MD  04/28/2021 10:21 AM  Nursing: Torrie Mayers, RN  04/28/2021 10:21 AM  RN Care Manager: 04/28/2021 10:21 AM  Social Worker: Gwenevere Ghazi, MSW, [place], Bridget Hartshorn  04/28/2021 10:21 AM  Recreational Therapist: Garret Reddish, Drue Flirt, LRT  04/28/2021 10:21 AM  Other: Kiva [place], LCSWA 04/28/2021 10:21 AM  Other: Jillyn Hidden, MSW, LCSW, LCAS  04/28/2021 10:21 AM  Other: 04/28/2021 10:21 AM    Scribe for Treatment  Team: Corky Crafts, LCSWA 04/28/2021 10:21 AM

## 2021-04-28 NOTE — [facility] Suicide Risk Assessment (Signed)
[facility] Admission Suicide Risk Assessment   Nursing information obtained from:  Patient Demographic factors:  Caucasian,Adolescent or young adult Current Mental Status:  NA Loss Factors:  Financial problems / change in socioeconomic status Historical Factors:  NA Risk Reduction Factors:  Employed,Living with another person, especially a relative,Positive social support  Total Time spent with patient: 1 hour Principal Problem: <principal problem not specified> Diagnosis:  Active Problems:   Bipolar affective disorder, current episode manic (HCC)  Subjective Data: This is a 20 year old woman with a history of bipolar psychotic disorder who presented to the hospital with bizarre behavior at home paranoia disorganized thinking.  No evidence of self-harm.  Denies any suicidal ideation.  Not acting to harm herself at this point.  Denies feeling depressed.  Continued Clinical Symptoms:  Alcohol Use Disorder Identification Test Final Score (AUDIT): 23 The "Alcohol Use Disorders Identification Test", Guidelines for Use in Primary Care, Second Edition.  World Science writer [facility]). Score between 0-7:  no or low risk or alcohol related problems. Score between 8-15:  moderate risk of alcohol related problems. Score between 16-19:  high risk of alcohol related problems. Score 20 or above:  warrants further diagnostic evaluation for alcohol dependence and treatment.   CLINICAL FACTORS:   Bipolar Disorder:   Mixed State Schizophrenia:   Less than 81 years old   Musculoskeletal: Strength & Muscle Tone: within normal limits Gait & Station: normal Patient leans: N/A  Psychiatric Specialty Exam:  Presentation  General Appearance: No data recorded Eye Contact:No data recorded Speech:No data recorded Speech Volume:No data recorded Handedness:No data recorded  Mood and Affect  Mood:No data recorded Affect:No data recorded  Thought Process  Thought Processes:No data recorded Descriptions  of Associations:No data recorded Orientation:No data recorded Thought Content:No data recorded History of Schizophrenia/Schizoaffective disorder:No  Duration of Psychotic Symptoms:Less than six months  Hallucinations:No data recorded Ideas of Reference:No data recorded Suicidal Thoughts:No data recorded Homicidal Thoughts:No data recorded  Sensorium  Memory:No data recorded Judgment:No data recorded Insight:No data recorded  Executive Functions  Concentration:No data recorded Attention Span:No data recorded Recall:No data recorded Fund of Knowledge:No data recorded Language:No data recorded  Psychomotor Activity  Psychomotor Activity:No data recorded  Assets  Assets:No data recorded  Sleep  Sleep:No data recorded   Physical Exam: Physical Exam Vitals and nursing note reviewed.  Constitutional:      Appearance: Normal appearance.  HENT:     Head: Normocephalic and atraumatic.     Mouth/Throat:     Pharynx: Oropharynx is clear.  Eyes:     Pupils: Pupils are equal, round, and reactive to light.  Cardiovascular:     Rate and Rhythm: Normal rate and regular rhythm.  Pulmonary:     Effort: Pulmonary effort is normal.     Breath sounds: Normal breath sounds.  Abdominal:     General: Abdomen is flat.     Palpations: Abdomen is soft.  Musculoskeletal:        General: Normal range of motion.  Skin:    General: Skin is warm and dry.  Neurological:     General: No focal deficit present.     Mental Status: She is alert. Mental status is at baseline.  Psychiatric:        Attention and Perception: She is inattentive.        Mood and Affect: Mood is anxious.        Speech: Speech is tangential.        Behavior: Behavior is agitated. Behavior is  not hyperactive.        Thought Content: Thought content is paranoid. Thought content does not include homicidal or suicidal ideation.        Cognition and Memory: Cognition is impaired.        Judgment: Judgment is  impulsive.    Review of Systems  Constitutional: Negative.   HENT: Negative.   Eyes: Negative.   Respiratory: Negative.   Cardiovascular: Negative.   Gastrointestinal: Negative.   Musculoskeletal: Negative.   Skin: Negative.   Neurological: Negative.   Psychiatric/Behavioral: Negative.    Blood pressure 133/74, pulse 97, temperature 98.3 F (36.8 C), temperature source Oral, resp. rate 16, height 5\' 2"  (1.575 m), weight 70.8 kg, SpO2 100 %, unknown if currently breastfeeding. Body mass index is 28.53 kg/m.   COGNITIVE FEATURES THAT CONTRIBUTE TO RISK:  Polarized thinking    SUICIDE RISK:   Minimal: No identifiable suicidal ideation.  Patients presenting with no risk factors but with morbid ruminations; may be classified as minimal risk based on the severity of the depressive symptoms  PLAN OF CARE: Continue Abilify for psychosis and bipolar disorder.  Continue engagement in individual and group therapy.  Ongoing assessment of dangerousness prior to discharge  I certify that inpatient services furnished can reasonably be expected to improve the patient's condition.   , MD 04/28/2021, 7:10 PM

## 2021-04-29 NOTE — BHH Group Notes (Signed)
LCSW Group Therapy Note  04/29/2021 2:42 PM  Type of Therapy/Topic:  Group Therapy:  Balance in Life  Participation Level:  Did Not Attend  Description of Group:    This group will address the concept of balance and how it feels and looks when one is unbalanced. Patients will be encouraged to process areas in their lives that are out of balance and identify reasons for remaining unbalanced. Facilitators will guide patients in utilizing problem-solving interventions to address and correct the stressor making their life unbalanced. Understanding and applying boundaries will be explored and addressed for obtaining and maintaining a balanced life. Patients will be encouraged to explore ways to assertively make their unbalanced needs known to significant others in their lives, using other group members and facilitator for support and feedback.  Therapeutic Goals: 1. Patient will identify two or more emotions or situations they have that consume much of in their lives. 2. Patient will identify signs/triggers that life has become out of balance:  3. Patient will identify two ways to set boundaries in order to achieve balance in their lives:  4. Patient will demonstrate ability to communicate their needs through discussion and/or role plays  Summary of Patient Progress: Patient did not attend group despite encouraged participation.    Therapeutic Modalities:   Cognitive Behavioral Therapy Solution-Focused Therapy Assertiveness Training  Gwenevere Ghazi, MSW, [place], [place] 04/29/2021 2:42 PM

## 2021-04-29 NOTE — Progress Notes (Signed)
Recreation Therapy Notes  Date: 04/29/2021  Time: 9:30 am  Location: Courtyard    Behavioral response: Appropriate   Intervention Topic: Animal Assisted Therapy   Discussion/Intervention:  Animal Assisted Therapy took place today during group.  Animal Assisted Therapy is the planned inclusion of an animal in a patient's treatment plan. The patients were able to engage in therapy with an animal during group. Participants were educated on what a service dog is and the different between a support dog and a service dog. Patient were informed on the many animal needs there are and how their needs are similar. Individuals were enlightened on the process to get a service animal or support animal. Patients got the opportunity to pet the animal and were offered emotional support from the animal and staff.  Clinical Observations/Feedback:  Patient came to group and was on topic and was focused on what peers and staff had to say. Participant shared their experiences and history with animals. Individual was social with peers, staff and animal while participating in group.  Shavette Shoaff LRT/CTRS         Maleko Greulich 04/29/2021 11:32 AM

## 2021-04-29 NOTE — Progress Notes (Signed)
[facility] MD Progress Note  04/29/2021 5:55 PM Chelsea Patel  MRN:  [id number] Subjective: Patient appears improved.  Patient seen and chart reviewed.  No new complaints.  Affect and behavior were more appropriate today.  Did not mention any odd somatic symptoms did not have any derailed thoughts.  Tolerating medicine well.  Taking care of herself well.  Interacting with others on the unit appropriately Principal Problem: Bipolar affective disorder, current episode manic (HCC) Diagnosis: Principal Problem:   Bipolar affective disorder, current episode manic (HCC)  Total Time spent with patient: 30 minutes  Past Psychiatric History: Past history of bipolar disorder  Past Medical History:  Past Medical History:  Diagnosis Date  . Asthma    hx of  . Halitosis   . Rhinitis, allergic   . Tonsillith   . Tonsillitis    chronic    Past Surgical History:  Procedure Laterality Date  . TONSILLECTOMY AND ADENOIDECTOMY N/A 03/22/2017   Procedure: TONSILLECTOMY AND POSSIBLY  ADENOIDECTOMY;  Surgeon: Bud Face, MD;  Location[facility];  Service: ENT;  Laterality: N/A;  upreg   Family History: History reviewed. No pertinent family history. Family Psychiatric  History: See previous Social History:  Social History   Substance and Sexual Activity  Alcohol Use No     Social History   Substance and Sexual Activity  Drug Use No    Social History   Socioeconomic History  . Marital status: Single    Spouse name: Not on file  . Number of children: Not on file  . Years of education: Not on file  . Highest education level: Not on file  Occupational History  . Not on file  Tobacco Use  . Smoking status: Never Smoker  . Smokeless tobacco: Never Used  Vaping Use  . Vaping Use: Every day  Substance and Sexual Activity  . Alcohol use: No  . Drug use: No  . Sexual activity: Not on file  Other Topics Concern  . Not on file  Social History Narrative  . Not on file   Social  Determinants of Health   Financial Resource Strain: Not on file  Food Insecurity: Not on file  Transportation Needs: Not on file  Physical Activity: Not on file  Stress: Not on file  Social Connections: Not on file   Additional Social History:                         Sleep: Fair  Appetite:  Fair  Current Medications: Current Facility-Administered Medications  Medication Dose Route Frequency Provider Last Rate Last Admin  . acetaminophen (TYLENOL) tablet 650 mg  650 mg Oral Q6H PRN Xan Ingraham T, MD   650 mg at 04/27/21 1612  . albuterol (VENTOLIN HFA) 108 (90 Base) MCG/ACT inhaler 2 puff  2 puff Inhalation Q6H PRN Clarise Chacko, Jackquline Denmark, MD   2 puff at 04/29/21 1742  . alum & mag hydroxide-simeth (MAALOX/MYLANTA) 200-200-20 MG/5ML suspension 30 mL  30 mL Oral Q4H PRN Tine Mabee T, MD      . ARIPiprazole (ABILIFY) tablet 20 mg  20 mg Oral Daily Effie Wahlert, Jackquline Denmark, MD   20 mg at 04/29/21 0850  . hydrOXYzine (ATARAX/VISTARIL) tablet 50 mg  50 mg Oral TID PRN Savvy Peeters, Jackquline Denmark, MD   50 mg at 04/29/21 1227  . LORazepam (ATIVAN) tablet 2 mg  2 mg Oral Q6H PRN Whitman Meinhardt, Jackquline Denmark, MD   2 mg at 04/29/21 1743  Or  . LORazepam (ATIVAN) injection 2 mg  2 mg Intramuscular Q6H PRN Viraj Liby T, MD      . magnesium hydroxide (MILK OF MAGNESIA) suspension 30 mL  30 mL Oral Daily PRN Mally Gavina T, MD      . temazepam (RESTORIL) capsule 15 mg  15 mg Oral QHS PRN Ozzie Knobel, Jackquline Denmark, MD   15 mg at 04/28/21 2018    Lab Results: No results found for this or any previous visit (from the past 48 hour(s)).  Blood Alcohol level:  Lab Results  Component Value Date   ETH <10 04/26/2021   ETH <10 03/26/2020    Metabolic Disorder Labs: Lab Results  Component Value Date   HGBA1C 4.9 04/26/2021   MPG 93.93 04/26/2021   No results found for: PROLACTIN Lab Results  Component Value Date   CHOL 185 04/26/2021   TRIG 53 04/26/2021   HDL 68 04/26/2021   CHOLHDL 2.7 04/26/2021   VLDL 11 04/26/2021    LDLCALC 106 (H) 04/26/2021    Physical Findings: AIMS:  , ,  ,  ,    CIWA:    COWS:     Musculoskeletal: Strength & Muscle Tone: within normal limits Gait & Station: normal Patient leans: N/A  Psychiatric Specialty Exam:  Presentation  General Appearance: No data recorded Eye Contact:No data recorded Speech:No data recorded Speech Volume:No data recorded Handedness:No data recorded  Mood and Affect  Mood:No data recorded Affect:No data recorded  Thought Process  Thought Processes:No data recorded Descriptions of Associations:No data recorded Orientation:No data recorded Thought Content:No data recorded History of Schizophrenia/Schizoaffective disorder:No  Duration of Psychotic Symptoms:Less than six months  Hallucinations:No data recorded Ideas of Reference:No data recorded Suicidal Thoughts:No data recorded Homicidal Thoughts:No data recorded  Sensorium  Memory:No data recorded Judgment:No data recorded Insight:No data recorded  Executive Functions  Concentration:No data recorded Attention Span:No data recorded Recall:No data recorded Fund of Knowledge:No data recorded Language:No data recorded  Psychomotor Activity  Psychomotor Activity:No data recorded  Assets  Assets:No data recorded  Sleep  Sleep:No data recorded   Physical Exam: Physical Exam Vitals and nursing note reviewed.  Constitutional:      Appearance: Normal appearance.  HENT:     Head: Normocephalic and atraumatic.     Mouth/Throat:     Pharynx: Oropharynx is clear.  Eyes:     Pupils: Pupils are equal, round, and reactive to light.  Cardiovascular:     Rate and Rhythm: Normal rate and regular rhythm.  Pulmonary:     Effort: Pulmonary effort is normal.     Breath sounds: Normal breath sounds.  Abdominal:     General: Abdomen is flat.     Palpations: Abdomen is soft.  Musculoskeletal:        General: Normal range of motion.  Skin:    General: Skin is warm and dry.   Neurological:     General: No focal deficit present.     Mental Status: She is alert. Mental status is at baseline.  Psychiatric:        Mood and Affect: Mood normal.        Thought Content: Thought content normal.    Review of Systems  Constitutional: Negative.   HENT: Negative.   Eyes: Negative.   Respiratory: Negative.   Cardiovascular: Negative.   Gastrointestinal: Negative.   Musculoskeletal: Negative.   Skin: Negative.   Neurological: Negative.   Psychiatric/Behavioral: Negative.    Blood pressure 102/83, pulse 99, temperature 98.8  F (37.1 C), temperature source Oral, resp. rate 16, height 5\' 2"  (1.575 m), weight 70.8 kg, SpO2 93 %, unknown if currently breastfeeding. Body mass index is 28.53 kg/m.   Treatment Plan Summary: Medication management and Plan Tolerating medication with current Abilify dose.  Supportive counseling done with patient discussing outpatient treatment needs.  If she sleeps well and is doing well tomorrow we will probably consider discharge.  , MD 04/29/2021, 5:55 PM

## 2021-04-29 NOTE — Progress Notes (Addendum)
Patient presents with an irritable affect. Patient denies SI/HI/AVH, endorses anxiety. Patient compliant with medication administration per MD orders. Patient observed interacting appropriately with peers but can be demanding and irritable with staff. Patient given education, support, and encouragement to be active in hi her treatment plan. Patient being monitored Q 15 minutes for safety per unit protocol. Pt remains safe on the unit.

## 2021-04-29 NOTE — Progress Notes (Signed)
Recreation Therapy Notes  INPATIENT RECREATION THERAPY ASSESSMENT  Patient Details Name: Chelsea Patel MRN: [id number] DOB: [birth year] Today's Date: 04/29/2021       Information Obtained From: Patient  Able to Participate in Assessment/Interview: Yes  Patient Presentation: Responsive  Reason for Admission (Per Patient): Active Symptoms,Other (Comments) (I brought myself here.)  Patient Stressors:    Coping Skills:   Chief Operating Officer  Leisure Interests (2+):  Music - Listen (Ride horses)  Frequency of Recreation/Participation: Monthly  Awareness of Community Resources:  Yes  Community Resources:  Gym  Current Use: Yes  If no, Barriers?:    Expressed Interest in [employer] Information: Yes  County [place] Residence:  [place]  Patient Main Form of Transportation: Set designer  Patient Strengths:  Helpful and caring  Patient Identified Areas of Improvement:  Not having an attitude  Patient Goal for Hospitalization:  To get medication changed  Current SI (including self-harm):  No  Current HI:  No  Current AVH: No  Staff Intervention Plan: Group Attendance,Collaborate with Interdisciplinary Treatment Team  Consent to Intern Participation: N/A  Marsia Cino 04/29/2021, 2:17 PM

## 2021-04-29 NOTE — Progress Notes (Signed)
Recreation Therapy Notes  INPATIENT RECREATION TR PLAN  Patient Details Name: Reida Hem MRN: [id number] DOB: January 12, 2001 Today's Date: 04/29/2021  Rec Therapy Plan Is patient appropriate for Therapeutic Recreation?: Yes Treatment times per week: at least 3 Estimated Length of Stay: 5-7 days TR Treatment/Interventions: Group participation (Comment)  Discharge Criteria Pt will be discharged from therapy if:: Discharged Treatment plan/goals/alternatives discussed and agreed upon by:: Patient/family  Discharge Summary     Javonni Macke 04/29/2021, 2:24 PM

## 2021-04-29 NOTE — Plan of Care (Signed)
Patient presents less intrusive than when this writer had her earlier in this admission  Problem: Education: Goal: Emotional status will improve Outcome: Progressing Goal: Mental status will improve Outcome: Progressing

## 2021-04-30 ENCOUNTER — Other Ambulatory Visit: Payer: Self-pay

## 2021-04-30 MED ORDER — ARIPIPRAZOLE 20 MG PO TABS: 20.0000 mg | ORAL_TABLET | Freq: Every day | ORAL | 1 refills | Status: DC

## 2021-04-30 MED ORDER — ARIPIPRAZOLE 20 MG PO TABS
20.0000 mg | ORAL_TABLET | Freq: Every day | ORAL | 0 refills | Status: DC
  Filled 2021-04-30 (×2): qty 7, 7d supply, fill #0

## 2021-04-30 MED ORDER — ALBUTEROL SULFATE HFA 108 (90 BASE) MCG/ACT IN AERS: 2.0000 | INHALATION_SPRAY | Freq: Four times a day (QID) | RESPIRATORY_TRACT | 2 refills | Status: DC | PRN

## 2021-04-30 NOTE — BHH Group Notes (Signed)
LCSW Group Therapy Note  04/30/2021 2:09 PM  Type of Therapy and Topic:  Group Therapy:  Feelings around Relapse and Recovery  Participation Level:  Did Not Attend   Description of Group:    Patients in this group will discuss emotions they experience before and after a relapse. They will process how experiencing these feelings, or avoidance of experiencing them, relates to having a relapse. Facilitator will guide patients to explore emotions they have related to recovery. Patients will be encouraged to process which emotions are more powerful. They will be guided to discuss the emotional reaction significant others in their lives may have to their relapse or recovery. Patients will be assisted in exploring ways to respond to the emotions of others without this contributing to a relapse.  Therapeutic Goals: 1. Patient will identify two or more emotions that lead to a relapse for them 2. Patient will identify two emotions that result when they relapse 3. Patient will identify two emotions related to recovery 4. Patient will demonstrate ability to communicate their needs through discussion and/or role plays   Summary of Patient Progress: Patient did not attend group despite encouraged participation.    Therapeutic Modalities:   Cognitive Behavioral Therapy Solution-Focused Therapy Assertiveness Training Relapse Prevention Therapy   Shaman Muscarella, MSW, LCSWA, LCASA 04/30/2021 2:09 PM  

## 2021-04-30 NOTE — Progress Notes (Signed)
D: Pt alert and oriented. Pt denies experiencing any pain, SI/HI, or AVH at this time. Pt reports she will be able to keep herself safe when she returns home.   A: Pt received discharge and medication education/information. Pt belongings were returned and signed for at this time to include 7 day supply of medication along with printed prescription.   R: Pt verbalized understanding of discharge and medication education/information.  Pt escorted to physician's on call parking where safe transport picked her up.

## 2021-04-30 NOTE — Progress Notes (Signed)
  [facility] Adult Case Management Discharge Plan :  Will you be returning to the same living situation after discharge:  Yes,  partner's home At discharge, do you have transportation home?: Yes,  pt's family member Do you have the ability to pay for your medications: No.  Release of information consent forms completed and in the chart;  Patient's signature needed at discharge.  Patient to Follow up at:  Follow-up Information    [facility] Follow up on 05/05/2021.   Why: Your appointment is scheduled with Lorella Nimrod, Peer Support Specialist, 7:30am on Wednesday May 11th. Meet in person at the [facility] office. Thanks! Contact information: [address] [place] [address] [phone]               Next level of care provider has access to [facility] Link:no  Safety Planning and Suicide Prevention discussed: Yes,  pt's friend     Has patient been referred to the Quitline?: Patient refused referral  Patient has been referred for addiction treatment: Pt. refused referral  Darvis Croft A [place], LCSWA 04/30/2021, 9:09 AM

## 2021-04-30 NOTE — Progress Notes (Signed)
D: Pt alert and oriented. Pt rates depression 0/10, hopelessness 0/10, and anxiety 3/10. Pt goal: "Going home". Pt reports energy level as normal and concentration as being good. Pt reports sleep last night as being good. Pt did receive medications for sleep and did find them helpful. Pt denies experiencing any pain at this time. Pt denies experiencing any SI/HI, or AVH at this time.   A: Scheduled medications administered to pt, per MD orders. Support and encouragement provided. Frequent verbal contact made. Routine safety checks conducted q15 minutes.   R: No adverse drug reactions noted. Pt verbally contracts for safety at this time. Pt complaint with medications and treatment plan. Pt interacts well with others on the unit. Pt remains safe at this time. Will continue to monitor.

## 2021-04-30 NOTE — Discharge Summary (Signed)
Physician Discharge Summary Note  Patient:  Chelsea Patel is an 20 y.o., female MRN:  [id number] DOB:  03/21/2001 Patient phone:  [phone] (home)  Patient address:   [address] [place] [address],  Total Time spent with patient: 45 minutes  Date of Admission:  04/27/2021 Date of Discharge: 04/30/2021  Reason for Admission: Patient was admitted to the hospital after presenting under involuntary petition for confused psychotic behavior  Principal Problem: Bipolar affective disorder, current episode manic [facility]) Discharge Diagnoses: Principal Problem:   Bipolar affective disorder, current episode manic [facility])   Past Psychiatric History: Patient has a past history of bipolar disorder with psychosis  Past Medical History:  Past Medical History:  Diagnosis Date  . Asthma    hx of  . Halitosis   . Rhinitis, allergic   . Tonsillith   . Tonsillitis    chronic    Past Surgical History:  Procedure Laterality Date  . TONSILLECTOMY AND ADENOIDECTOMY N/A 03/22/2017   Procedure: TONSILLECTOMY AND POSSIBLY  ADENOIDECTOMY;  Surgeon: Bud Face, MD;  Location: [facility];  Service: ENT;  Laterality: N/A;  upreg   Family History: History reviewed. No pertinent family history. Family Psychiatric  History: None reported Social History:  Social History   Substance and Sexual Activity  Alcohol Use No     Social History   Substance and Sexual Activity  Drug Use No    Social History   Socioeconomic History  . Marital status: Single    Spouse name: Not on file  . Number of children: Not on file  . Years of education: Not on file  . Highest education level: Not on file  Occupational History  . Not on file  Tobacco Use  . Smoking status: Never Smoker  . Smokeless tobacco: Never Used  Vaping Use  . Vaping Use: Every day  Substance and Sexual Activity  . Alcohol use: No  . Drug use: No  . Sexual activity: Not on file  Other Topics Concern  . Not on  file  Social History Narrative  . Not on file   Social Determinants of Health   Financial Resource Strain: Not on file  Food Insecurity: Not on file  Transportation Needs: Not on file  Physical Activity: Not on file  Stress: Not on file  Social Connections: Not on file    Hospital Course: Patient was kept on 15-minute checks.  Engaged in individual and group therapy and assessment.  Attended treatment team.  Patient was continued on Abilify the dose of which was increased to 20 mg a day.  Tolerated medicine well.  Showed significant improvement quickly in mood affect and behavior.  At the time of discharge is lucid and clear with calm behavior good engagement in conversation no evidence of psychosis.  Patient is referred for further outpatient treatment at [facility] and counseled about the importance of continued medication and therapy.  Physical Findings: AIMS:  , ,  ,  ,    CIWA:    COWS:     Musculoskeletal: Strength & Muscle Tone: within normal limits Gait & Station: normal Patient leans: N/A                Psychiatric Specialty Exam:  Presentation  General Appearance: No data recorded Eye Contact:No data recorded Speech:No data recorded Speech Volume:No data recorded Handedness:No data recorded  Mood and Affect  Mood:No data recorded Affect:No data recorded  Thought Process  Thought Processes:No data recorded Descriptions of Associations:No  data recorded Orientation:No data recorded Thought Content:No data recorded History of Schizophrenia/Schizoaffective disorder:No  Duration of Psychotic Symptoms:Less than six months  Hallucinations:No data recorded Ideas of Reference:No data recorded Suicidal Thoughts:No data recorded Homicidal Thoughts:No data recorded  Sensorium  Memory:No data recorded Judgment:No data recorded Insight:No data recorded  Executive Functions  Concentration:No data recorded Attention Span:No data recorded Recall:No data  recorded Fund of Knowledge:No data recorded Language:No data recorded  Psychomotor Activity  Psychomotor Activity:No data recorded  Assets  Assets:No data recorded  Sleep  Sleep:No data recorded   Physical Exam: Physical Exam Vitals and nursing note reviewed.  Constitutional:      Appearance: Normal appearance.  HENT:     Head: Normocephalic and atraumatic.     Mouth/Throat:     Pharynx: Oropharynx is clear.  Eyes:     Pupils: Pupils are equal, round, and reactive to light.  Cardiovascular:     Rate and Rhythm: Normal rate and regular rhythm.  Pulmonary:     Effort: Pulmonary effort is normal.     Breath sounds: Normal breath sounds.  Abdominal:     General: Abdomen is flat.     Palpations: Abdomen is soft.  Musculoskeletal:        General: Normal range of motion.  Skin:    General: Skin is warm and dry.  Neurological:     General: No focal deficit present.     Mental Status: She is alert. Mental status is at baseline.  Psychiatric:        Mood and Affect: Mood normal.        Thought Content: Thought content normal.    Review of Systems  Constitutional: Negative.   HENT: Negative.   Eyes: Negative.   Respiratory: Negative.   Cardiovascular: Negative.   Gastrointestinal: Negative.   Musculoskeletal: Negative.   Skin: Negative.   Neurological: Negative.   Psychiatric/Behavioral: Negative.    Blood pressure (!) 121/95, pulse 90, temperature 98.8 F (37.1 C), temperature source Oral, resp. rate 17, height 5\' 2"  (1.575 m), weight 70.8 kg, SpO2 97 %, unknown if currently breastfeeding. Body mass index is 28.53 kg/m.      Has this patient used any form of tobacco in the last 30 days? (Cigarettes, Smokeless Tobacco, Cigars, and/or Pipes) Yes, No  Blood Alcohol level:  Lab Results  Component Value Date   ETH <10 04/26/2021   ETH <10 03/26/2020    Metabolic Disorder Labs:  Lab Results  Component Value Date   HGBA1C 4.9 04/26/2021   MPG 93.93  04/26/2021   No results found for: PROLACTIN Lab Results  Component Value Date   CHOL 185 04/26/2021   TRIG 53 04/26/2021   HDL 68 04/26/2021   CHOLHDL 2.7 04/26/2021   VLDL 11 04/26/2021   LDLCALC 106 (H) 04/26/2021    See Psychiatric Specialty Exam and Suicide Risk Assessment completed by Attending Physician prior to discharge.  Discharge destination:  Home  Is patient on multiple antipsychotic therapies at discharge:  No   Has Patient had three or more failed trials of antipsychotic monotherapy by history:  No  Recommended Plan for Multiple Antipsychotic Therapies: NA  Discharge Instructions    Diet - low sodium heart healthy   Complete by: As directed    Increase activity slowly   Complete by: As directed      Allergies as of 04/30/2021      Reactions   Omnicef [cefdinir] Other (See Comments)   GI upset   Latex Rash  Medication List    STOP taking these medications   fluconazole 150 MG tablet Commonly known as: DIFLUCAN   fluticasone 44 MCG/ACT inhaler Commonly known as: FLOVENT HFA   hydrOXYzine 25 MG tablet Commonly known as: ATARAX/VISTARIL   montelukast 10 MG tablet Commonly known as: SINGULAIR   nitrofurantoin (macrocrystal-monohydrate) 100 MG capsule Commonly known as: MACROBID   phenazopyridine 200 MG tablet Commonly known as: PYRIDIUM   Sprintec 28 0.25-35 MG-MCG tablet Generic drug: norgestimate-ethinyl estradiol     TAKE these medications     Indication  albuterol 108 (90 Base) MCG/ACT inhaler Commonly known as: VENTOLIN HFA Inhale 2 puffs into the lungs every 6 (six) hours as needed for wheezing or shortness of breath.  Indication: Asthma   ARIPiprazole 20 MG tablet Commonly known as: ABILIFY Take 1 tablet (20 mg total) by mouth daily. Start taking on: May 01, 2021 What changed:   medication strength  how much to take  Indication: Manic Phase of Manic-Depression   cetirizine 10 MG tablet Commonly known as: ZYRTEC Take  10 mg by mouth daily as needed for allergies.  Indication: Hayfever       Follow-up Information    [facility] Follow up on 05/05/2021.   Why: Your appointment is scheduled with Lorella Nimrod, Peer Support Specialist, 7:30am on Wednesday May 11th. Meet in person at the [facility] office. Thanks! Contact information: [address] [place] [address] [phone]               Follow-up recommendations:  Activity:  Activity as tolerated Diet:  Regular diet Other:  Follow-up is recommended with [facility]  Comments:   Medications provided at discharge  Signed: Mordecai Rasmussen, MD 04/30/2021, 10:05 AM

## 2021-04-30 NOTE — BHH Suicide Risk Assessment (Signed)
[facility] Discharge Suicide Risk Assessment   Principal Problem: Bipolar affective disorder, current episode manic [facility]) Discharge Diagnoses: Principal Problem:   Bipolar affective disorder, current episode manic (HCC)   Total Time spent with patient: 45 minutes  Musculoskeletal: Strength & Muscle Tone: within normal limits Gait & Station: normal Patient leans: N/A  Psychiatric Specialty Exam: Review of Systems  Constitutional: Negative.   HENT: Negative.   Eyes: Negative.   Respiratory: Negative.   Cardiovascular: Negative.   Gastrointestinal: Negative.   Musculoskeletal: Negative.   Skin: Negative.   Neurological: Negative.   Psychiatric/Behavioral: Negative.     Blood pressure (!) 121/95, pulse 90, temperature 98.8 F (37.1 C), temperature source Oral, resp. rate 17, height 5\' 2"  (1.575 m), weight 70.8 kg, SpO2 97 %, unknown if currently breastfeeding.Body mass index is 28.53 kg/m.  General Appearance: Casual  Eye Contact::  Good  Speech:  Clear and Coherent409  Volume:  Normal  Mood:  Euthymic  Affect:  Congruent  Thought Process:  Goal Directed  Orientation:  Full (Time, Place, and Person)  Thought Content:  Logical  Suicidal Thoughts:  No  Homicidal Thoughts:  No  Memory:  Immediate;   Fair Recent;   Fair Remote;   Fair  Judgement:  Fair  Insight:  Fair  Psychomotor Activity:  Normal  Concentration:  Fair  Recall:  [id number] of Knowledge:Fair  Language: Fair  Akathisia:  No  Handed:  Right  AIMS (if indicated):     Assets:  Desire for Improvement Physical Health  Sleep:  Number of Hours: 7.5  Cognition: WNL  ADL's:  Intact   Mental Status Per Nursing Assessment::   On Admission:  NA  Demographic Factors:  Adolescent or young adult  Loss Factors: NA  Historical Factors: Impulsivity  Risk Reduction Factors:   Living with another person, especially a relative and Positive social support  Continued Clinical Symptoms:  Bipolar Disorder:   Mixed  State  Cognitive Features That Contribute To Risk:  None    Suicide Risk:  Minimal: No identifiable suicidal ideation.  Patients presenting with no risk factors but with morbid ruminations; may be classified as minimal risk based on the severity of the depressive symptoms   Follow-up Information    [facility] Follow up on 05/05/2021.   Why: Your appointment is scheduled with 07/05/2021, Peer Support Specialist, 7:30am on Wednesday May 11th. Meet in person at the [facility] office. Thanks! Contact information: [address] [place] [phone]               Plan Of Care/Follow-up recommendations:  Activity:  Activity as tolerated Diet:  Regular diet Other:  Follow-up with outpatient services most likely with [facility].  Continue current medicine.  [phone], MD 04/30/2021, 9:59 AM

## 2021-07-05 ENCOUNTER — Other Ambulatory Visit: Payer: Self-pay | Admitting: Psychiatry

## 2022-03-17 ENCOUNTER — Ambulatory Visit: Payer: Self-pay | Admitting: Nurse Practitioner

## 2022-03-22 ENCOUNTER — Ambulatory Visit: Payer: Self-pay | Admitting: Internal Medicine

## 2022-05-02 ENCOUNTER — Encounter: Payer: Self-pay | Admitting: Nurse Practitioner

## 2022-05-02 ENCOUNTER — Ambulatory Visit (INDEPENDENT_AMBULATORY_CARE_PROVIDER_SITE_OTHER): Payer: BLUE CROSS/BLUE SHIELD | Admitting: Nurse Practitioner

## 2022-05-02 VITALS — BP 136/73 | HR 98 | Temp 98.4°F | Resp 16 | Ht 62.0 in | Wt 181.0 lb

## 2022-05-02 DIAGNOSIS — Z9149 Other personal history of psychological trauma, not elsewhere classified: Secondary | ICD-10-CM

## 2022-05-02 DIAGNOSIS — E538 Deficiency of other specified B group vitamins: Secondary | ICD-10-CM

## 2022-05-02 DIAGNOSIS — J4540 Moderate persistent asthma, uncomplicated: Secondary | ICD-10-CM

## 2022-05-02 DIAGNOSIS — R5383 Other fatigue: Secondary | ICD-10-CM | POA: Diagnosis not present

## 2022-05-02 DIAGNOSIS — E559 Vitamin D deficiency, unspecified: Secondary | ICD-10-CM

## 2022-05-02 DIAGNOSIS — Z7689 Persons encountering health services in other specified circumstances: Secondary | ICD-10-CM

## 2022-05-02 DIAGNOSIS — E611 Iron deficiency: Secondary | ICD-10-CM | POA: Diagnosis not present

## 2022-05-02 DIAGNOSIS — E782 Mixed hyperlipidemia: Secondary | ICD-10-CM

## 2022-05-02 MED ORDER — ALBUTEROL SULFATE HFA 108 (90 BASE) MCG/ACT IN AERS: 2.0000 | INHALATION_SPRAY | Freq: Four times a day (QID) | RESPIRATORY_TRACT | 2 refills | Status: DC | PRN

## 2022-05-02 NOTE — Progress Notes (Unsigned)
[facility] ?[address] ?[place], [place] [address] ? ?Internal MEDICINE  ?Office Visit Note ? ?Patient Name: Chelsea Patel ? [id number]  ?[id number] ? ?Date of Service: 05/02/2022 ? ? ?Complaints/HPI ?Pt is here for establishment of PCP. ?Chief Complaint  ?Patient presents with  ? New Patient (Initial Visit)  ?  Establish care  ? ?HPI ?Chelsea Patel presents for a new patient visit to establish care.  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  ?Exercise: not regularly.  ?Substances: vaping daily, no rec drugs, drinks 5 beers per week.  ?Pain: no pain.  ?Questions or concerns: ?Age appropriate screenings: ?Routine labs ?Pap will be done with OBGYN - tomorrow.  ? ?Allergic rhinitis. --- ? ? ?Current Medication: ?Outpatient Encounter Medications as of 05/02/2022  ?Medication Sig  ? albuterol (VENTOLIN HFA) 108 (90 Base) MCG/ACT inhaler Inhale 2 puffs into the lungs every 6 (six) hours as needed for wheezing or shortness of breath.  ? chlorpheniramine (CHLOR-TRIMETON) 4 MG tablet Take 4 mg by mouth 2 (two) times daily as needed for allergies.  ? Cranberry 500 MG CHEW Chew by mouth.  ? fluticasone (FLOVENT HFA) 220 MCG/ACT inhaler Inhale into the lungs 2 (two) times daily.  ? [DISCONTINUED] ARIPiprazole (ABILIFY) 20 MG tablet Take 1 tablet (20 mg total) by mouth daily. (Patient not taking: Reported on 05/02/2022)  ? [DISCONTINUED] ARIPiprazole (ABILIFY) 20 MG tablet Take 1 tablet (20 mg total) by mouth daily. (Patient not taking: Reported on 05/02/2022)  ? [DISCONTINUED] cetirizine (ZYRTEC) 10 MG tablet Take 10 mg by mouth daily as needed for allergies.  (Patient not taking: Reported on 05/02/2022)  ? ?No  facility-administered encounter medications on file as of 05/02/2022.  ? ? ?Surgical History: ?Past Surgical History:  ?Procedure Laterality Date  ? TONSILLECTOMY AND ADENOIDECTOMY N/A 03/22/2017  ? Procedure: TONSILLECTOMY AND POSSIBLY  ADENOIDECTOMY;  Surgeon: Chelsea Face, MD;  Location: [facility];  Service: ENT;  Laterality: N/A;  upreg  ? ? ?Medical History: ?Past Medical History:  ?Diagnosis Date  ? Asthma   ? hx of  ? Halitosis   ? Rhinitis, allergic   ? Tonsillith   ? Tonsillitis   ? chronic  ? ? ?Family History: ?History reviewed. No pertinent family history. ? ?Social History  ? ?Socioeconomic History  ? Marital status: Single  ?  Spouse name: Not on file  ? Number of children: Not on file  ? Years of education: Not on file  ? Highest education level: Not on file  ?Occupational History  ? Not on file  ?Tobacco Use  ? Smoking status: Never  ? Smokeless tobacco: Never  ?Vaping Use  ? Vaping Use: Every day  ?Substance and Sexual Activity  ? Alcohol use: No  ? Drug use: No  ? Sexual activity: Not on file  ?Other Topics Concern  ? Not on file  ?Social History Narrative  ? Not on file  ? ?Social Determinants of Health  ? ?Financial Resource Strain: Not on file  ?Food Insecurity: Not on file  ?Transportation Needs: Not on file  ?Physical Activity: Not on file  ?Stress: Not on file  ?Social Connections: Not on file  ?Intimate Partner Violence:  Not on file  ? ? ? ?Review of Systems ? ?Vital Signs: ?BP 136/73   Pulse (!) 102   Temp 98.4 ?F (36.9 ?C)   Resp 16   Ht 5\' 2"  (1.575 m)   Wt 181 lb (82.1 kg)   SpO2 98%   BMI 33.11 kg/m?  ? ? ?Physical Exam ? ? ? ?Assessment/Plan: ?There are no diagnoses linked to this encounter. ? ? ?General Counseling: Chelsea Patel verbalizes understanding of the findings of todays visit and agrees with plan of treatment. I have discussed any further diagnostic evaluation that may be needed or ordered today. We also reviewed her medications today. she has been encouraged to  call the office with any questions or concerns that should arise related to todays visit. ? ? ? ?Counseling: ? ?[name] Controlled Substance Database was reviewed by me. ? ?No orders of the defined types were placed in this encounter. ? ? ?No orders of the defined types were placed in this encounter. ? ? ?No follow-ups on file. ? ?Time spent:*** Minutes ? ?This patient was seen by , FNP-C in collaboration with Chelsea Patel as a part of collaborative care agreement. ? ? ? ?Chelsea Budge R. Beverely Risen, MSN, FNP-C ?Internal Medicine ?

## 2022-05-16 ENCOUNTER — Telehealth: Payer: Self-pay

## 2022-05-16 NOTE — Telephone Encounter (Signed)
Mb full, sent mychart message to confirm 05/20/22 appointment-Toni

## 2022-05-20 ENCOUNTER — Encounter: Payer: BLUE CROSS/BLUE SHIELD | Admitting: Nurse Practitioner

## 2022-06-07 ENCOUNTER — Encounter: Payer: BLUE CROSS/BLUE SHIELD | Admitting: Nurse Practitioner

## 2022-06-21 ENCOUNTER — Encounter: Payer: Self-pay | Admitting: Nurse Practitioner

## 2022-06-22 ENCOUNTER — Encounter: Payer: BLUE CROSS/BLUE SHIELD | Admitting: Nurse Practitioner

## 2023-02-22 ENCOUNTER — Encounter: Payer: BLUE CROSS/BLUE SHIELD | Admitting: Nurse Practitioner

## 2023-02-22 ENCOUNTER — Encounter: Payer: Self-pay | Admitting: Nurse Practitioner

## 2023-02-22 ENCOUNTER — Telehealth: Payer: Self-pay

## 2023-02-22 ENCOUNTER — Telehealth (INDEPENDENT_AMBULATORY_CARE_PROVIDER_SITE_OTHER): Payer: BLUE CROSS/BLUE SHIELD | Admitting: Nurse Practitioner

## 2023-02-22 ENCOUNTER — Other Ambulatory Visit: Payer: Self-pay

## 2023-02-22 ENCOUNTER — Telehealth: Payer: Self-pay | Admitting: Nurse Practitioner

## 2023-02-22 VITALS — Temp 99.8°F | Ht 62.0 in | Wt 170.0 lb

## 2023-02-22 DIAGNOSIS — J4540 Moderate persistent asthma, uncomplicated: Secondary | ICD-10-CM

## 2023-02-22 DIAGNOSIS — U071 COVID-19: Secondary | ICD-10-CM

## 2023-02-22 DIAGNOSIS — J069 Acute upper respiratory infection, unspecified: Secondary | ICD-10-CM

## 2023-02-22 MED ORDER — ALBUTEROL SULFATE HFA 108 (90 BASE) MCG/ACT IN AERS: 2.0000 | INHALATION_SPRAY | Freq: Four times a day (QID) | RESPIRATORY_TRACT | 2 refills | Status: AC | PRN

## 2023-02-22 MED ORDER — NIRMATRELVIR/RITONAVIR (PAXLOVID)TABLET: 3.0000 | ORAL_TABLET | Freq: Two times a day (BID) | ORAL | 0 refills | Status: AC

## 2023-02-22 MED ORDER — HYDROCOD POLI-CHLORPHE POLI ER 10-8 MG/5ML PO SUER: 5.0000 mL | Freq: Two times a day (BID) | ORAL | 0 refills | Status: AC | PRN

## 2023-02-22 MED ORDER — FLUTICASONE PROPIONATE HFA 220 MCG/ACT IN AERO: 1.0000 | INHALATION_SPRAY | Freq: Two times a day (BID) | RESPIRATORY_TRACT | 1 refills | Status: DC

## 2023-02-22 MED ORDER — ALBUTEROL SULFATE HFA 108 (90 BASE) MCG/ACT IN AERS: 2.0000 | INHALATION_SPRAY | Freq: Four times a day (QID) | RESPIRATORY_TRACT | 2 refills | Status: DC | PRN

## 2023-02-22 MED ORDER — FLUTICASONE PROPIONATE HFA 220 MCG/ACT IN AERO: 1.0000 | INHALATION_SPRAY | Freq: Two times a day (BID) | RESPIRATORY_TRACT | 3 refills | Status: AC

## 2023-02-22 NOTE — Telephone Encounter (Signed)
Send by [employer]

## 2023-02-22 NOTE — Addendum Note (Signed)
Addended by: Jonetta Osgood on: 02/22/2023 03:41 PM   Modules accepted: Orders

## 2023-02-22 NOTE — Telephone Encounter (Signed)
Work note emailed to [employer]

## 2023-02-22 NOTE — Progress Notes (Signed)
[facility] [place], [place] [address]  Internal MEDICINE  Telephone Visit  Patient Name: Chelsea Patel  [id number]  [id number]  Date of Service: 02/22/2023  I connected with the patient at 1415 by telephone and verified the patients identity using two identifiers.   I discussed the limitations, risks, security and privacy concerns of performing an evaluation and management service by telephone and the availability of in person appointments. I also discussed with the patient that there may be a patient responsible charge related to the service.  The patient expressed understanding and agrees to proceed.    Chief Complaint  Patient presents with   Telephone Assessment    [id number]   Telephone Screen    Covid positive symptoms start yesterday    Sore Throat   Fatigue   Fever   Cough   Sinusitis    HPI Chelsea Patel presents for a telehealth virtual visit for covid positive URI. Covid test is positive Symptoms started yesterday Reports sore throat, fatigue, fever, cough, body aches and head congestion.  Also ear pain bilaterally.    Current Medication: Outpatient Encounter Medications as of 02/22/2023  Medication Sig   albuterol (VENTOLIN HFA) 108 (90 Base) MCG/ACT inhaler Inhale 2 puffs into the lungs every 6 (six) hours as needed for wheezing or shortness of breath.   chlorpheniramine (CHLOR-TRIMETON) 4 MG tablet Take 4 mg by mouth 2 (two) times daily as needed for allergies.   Cranberry 500 MG CHEW Chew by mouth.   fluticasone (FLOVENT HFA) 220 MCG/ACT inhaler Inhale into the lungs 2 (two) times daily.   chlorpheniramine-HYDROcodone (TUSSIONEX) 10-8 MG/5ML Take 5 mLs by mouth every 12 (twelve) hours as needed.   nirmatrelvir/ritonavir (PAXLOVID) 20 x 150 MG & 10 x '100MG'$  TABS Take 3 tablets by mouth 2 (two) times daily for 5 days.   No facility-administered encounter medications on file as of 02/22/2023.    Surgical History: Past Surgical History:   Procedure Laterality Date   TONSILLECTOMY AND ADENOIDECTOMY N/A 03/22/2017   Procedure: TONSILLECTOMY AND POSSIBLY  ADENOIDECTOMY;  Surgeon: Carloyn Manner, MD;  Location: [facility];  Service: ENT;  Laterality: N/A;  upreg    Medical History: Past Medical History:  Diagnosis Date   Asthma    hx of   Halitosis    Rhinitis, allergic    Tonsillith    Tonsillitis    chronic    Family History: Family History  Problem Relation Age of Onset   Cancer Other     Social History   Socioeconomic History   Marital status: Single    Spouse name: Not on file   Number of children: Not on file   Years of education: Not on file   Highest education level: Not on file  Occupational History   Not on file  Tobacco Use   Smoking status: Never   Smokeless tobacco: Never  Vaping Use   Vaping Use: Every day  Substance and Sexual Activity   Alcohol use: Yes    Alcohol/week: 5.0 standard drinks of alcohol    Types: 5 Cans of beer per week   Drug use: No   Sexual activity: Yes    Birth control/protection: None  Other Topics Concern   Not on file  Social History Narrative   Not on file   Social Determinants of Health   Financial Resource Strain: Not on file  Food Insecurity: Not on file  Transportation Needs: Not on file  Physical Activity: Not on file  Stress: Not on file  Social Connections: Not on file  Intimate Partner Violence: Not on file      Review of Systems  Constitutional:  Positive for fatigue and fever. Negative for chills.  HENT:  Positive for congestion, postnasal drip, rhinorrhea, sinus pressure, sinus pain, sneezing and sore throat.   Respiratory:  Positive for cough. Negative for chest tightness, shortness of breath and wheezing.   Cardiovascular: Negative.  Negative for chest pain and palpitations.  Neurological:  Positive for headaches.    Vital Signs: Temp 99.8 F (37.7 C)   Ht '5\' 2"'$  (1.575 m)   Wt 170 lb (77.1 kg)   BMI 31.09 kg/m     Observation/Objective: She is alert and oriented and engages in conversation appropriately. No acute distress noted.     Assessment/Plan: 1. Upper respiratory tract infection due to COVID-19 virus Paxlovid and tussionex prescribed for treatemnt with antiviral and to relieve cough - nirmatrelvir/ritonavir (PAXLOVID) 20 x 150 MG & 10 x '100MG'$  TABS; Take 3 tablets by mouth 2 (two) times daily for 5 days.  Dispense: 30 tablet; Refill: 0 - chlorpheniramine-HYDROcodone (TUSSIONEX) 10-8 MG/5ML; Take 5 mLs by mouth every 12 (twelve) hours as needed.  Dispense: 140 mL; Refill: 0   General Counseling: Chelsea Patel verbalizes understanding of the findings of today's phone visit and agrees with plan of treatment. I have discussed any further diagnostic evaluation that may be needed or ordered today. We also reviewed her medications today. she has been encouraged to call the office with any questions or concerns that should arise related to todays visit.  Return if symptoms worsen or fail to improve.   No orders of the defined types were placed in this encounter.   Meds ordered this encounter  Medications   nirmatrelvir/ritonavir (PAXLOVID) 20 x 150 MG & 10 x '100MG'$  TABS    Sig: Take 3 tablets by mouth 2 (two) times daily for 5 days.    Dispense:  30 tablet    Refill:  0   chlorpheniramine-HYDROcodone (TUSSIONEX) 10-8 MG/5ML    Sig: Take 5 mLs by mouth every 12 (twelve) hours as needed.    Dispense:  140 mL    Refill:  0    Time spent:10 Minutes Time spent with patient included reviewing progress notes, labs, imaging studies, and discussing plan for follow up.  [place] Controlled Substance Database was reviewed by me for overdose risk score (ORS) if appropriate.  This patient was seen by Jonetta Osgood, FNP-C in collaboration with Dr. Clayborn Bigness as a part of collaborative care agreement.  Merary Garguilo R. Valetta Fuller, MSN, FNP-C Internal medicine

## 2023-03-15 ENCOUNTER — Encounter: Payer: BLUE CROSS/BLUE SHIELD | Admitting: Nurse Practitioner

## 2023-05-01 ENCOUNTER — Encounter (HOSPITAL_COMMUNITY): Payer: Self-pay

## 2023-07-12 ENCOUNTER — Ambulatory Visit: Payer: Self-pay | Admitting: Dermatology

## 2023-07-24 ENCOUNTER — Ambulatory Visit: Payer: Self-pay | Admitting: Dermatology

## 2024-01-11 ENCOUNTER — Ambulatory Visit: Payer: Self-pay | Admitting: Family
